# Patient Record
Sex: Male | Born: 1950 | Race: Black or African American | Hispanic: No | Smoking: Former smoker
Health system: Southern US, Community
[De-identification: ages and names within clinical notes are randomized; demographics above are authoritative.]

## PROBLEM LIST (undated history)

## (undated) DIAGNOSIS — K219 Gastro-esophageal reflux disease without esophagitis: Secondary | ICD-10-CM

## (undated) DIAGNOSIS — I120 Hypertensive chronic kidney disease with stage 5 chronic kidney disease or end stage renal disease: Secondary | ICD-10-CM

## (undated) DIAGNOSIS — E785 Hyperlipidemia, unspecified: Secondary | ICD-10-CM

## (undated) DIAGNOSIS — I639 Cerebral infarction, unspecified: Secondary | ICD-10-CM

## (undated) DIAGNOSIS — I6203 Nontraumatic chronic subdural hemorrhage: Secondary | ICD-10-CM

## (undated) DIAGNOSIS — N186 End stage renal disease: Secondary | ICD-10-CM

## (undated) DIAGNOSIS — N289 Disorder of kidney and ureter, unspecified: Secondary | ICD-10-CM

## (undated) DIAGNOSIS — Z992 Dependence on renal dialysis: Secondary | ICD-10-CM

## (undated) DIAGNOSIS — E1122 Type 2 diabetes mellitus with diabetic chronic kidney disease: Secondary | ICD-10-CM

## (undated) DIAGNOSIS — I10 Essential (primary) hypertension: Secondary | ICD-10-CM

## (undated) DIAGNOSIS — F4320 Adjustment disorder, unspecified: Secondary | ICD-10-CM

## (undated) DIAGNOSIS — M109 Gout, unspecified: Secondary | ICD-10-CM

## (undated) DIAGNOSIS — I4891 Unspecified atrial fibrillation: Secondary | ICD-10-CM

---

## 1997-05-13 ENCOUNTER — Ambulatory Visit (HOSPITAL_COMMUNITY): Admission: RE | Admit: 1997-05-13 | Discharge: 1997-05-13 | Payer: Self-pay | Admitting: Cardiology

## 1999-01-05 ENCOUNTER — Encounter: Admission: RE | Admit: 1999-01-05 | Discharge: 1999-01-05 | Payer: Self-pay | Admitting: Cardiology

## 1999-01-05 ENCOUNTER — Encounter: Payer: Self-pay | Admitting: Cardiology

## 2000-02-14 ENCOUNTER — Observation Stay (HOSPITAL_COMMUNITY): Admission: EM | Admit: 2000-02-14 | Discharge: 2000-02-15 | Payer: Self-pay | Admitting: Emergency Medicine

## 2000-02-14 ENCOUNTER — Encounter: Payer: Self-pay | Admitting: Emergency Medicine

## 2000-04-24 ENCOUNTER — Encounter: Payer: Self-pay | Admitting: Surgery

## 2000-04-24 ENCOUNTER — Encounter: Admission: RE | Admit: 2000-04-24 | Discharge: 2000-04-24 | Payer: Self-pay | Admitting: Surgery

## 2000-07-03 ENCOUNTER — Ambulatory Visit (HOSPITAL_COMMUNITY): Admission: RE | Admit: 2000-07-03 | Discharge: 2000-07-03 | Payer: Self-pay | Admitting: Gastroenterology

## 2001-01-30 ENCOUNTER — Inpatient Hospital Stay (HOSPITAL_COMMUNITY): Admission: EM | Admit: 2001-01-30 | Discharge: 2001-02-01 | Payer: Self-pay

## 2017-10-29 ENCOUNTER — Emergency Department (HOSPITAL_COMMUNITY): Payer: Medicare Other

## 2017-10-29 ENCOUNTER — Observation Stay (HOSPITAL_COMMUNITY)
Admission: EM | Admit: 2017-10-29 | Discharge: 2017-10-30 | Disposition: A | Discharge status: Skilled Nursing Facility | Payer: Medicare Other | Attending: Internal Medicine | Admitting: Internal Medicine

## 2017-10-29 ENCOUNTER — Other Ambulatory Visit: Payer: Self-pay

## 2017-10-29 ENCOUNTER — Encounter (HOSPITAL_COMMUNITY): Payer: Self-pay | Admitting: Emergency Medicine

## 2017-10-29 DIAGNOSIS — Z794 Long term (current) use of insulin: Secondary | ICD-10-CM | POA: Insufficient documentation

## 2017-10-29 DIAGNOSIS — R4182 Altered mental status, unspecified: Secondary | ICD-10-CM | POA: Diagnosis not present

## 2017-10-29 DIAGNOSIS — F4320 Adjustment disorder, unspecified: Secondary | ICD-10-CM | POA: Insufficient documentation

## 2017-10-29 DIAGNOSIS — Z992 Dependence on renal dialysis: Secondary | ICD-10-CM | POA: Diagnosis not present

## 2017-10-29 DIAGNOSIS — E1122 Type 2 diabetes mellitus with diabetic chronic kidney disease: Secondary | ICD-10-CM | POA: Insufficient documentation

## 2017-10-29 DIAGNOSIS — I959 Hypotension, unspecified: Principal | ICD-10-CM | POA: Diagnosis present

## 2017-10-29 DIAGNOSIS — G9340 Encephalopathy, unspecified: Secondary | ICD-10-CM | POA: Diagnosis not present

## 2017-10-29 DIAGNOSIS — F411 Generalized anxiety disorder: Secondary | ICD-10-CM | POA: Diagnosis not present

## 2017-10-29 DIAGNOSIS — Z7901 Long term (current) use of anticoagulants: Secondary | ICD-10-CM | POA: Diagnosis not present

## 2017-10-29 DIAGNOSIS — K219 Gastro-esophageal reflux disease without esophagitis: Secondary | ICD-10-CM | POA: Insufficient documentation

## 2017-10-29 DIAGNOSIS — Z8249 Family history of ischemic heart disease and other diseases of the circulatory system: Secondary | ICD-10-CM | POA: Insufficient documentation

## 2017-10-29 DIAGNOSIS — E785 Hyperlipidemia, unspecified: Secondary | ICD-10-CM | POA: Insufficient documentation

## 2017-10-29 DIAGNOSIS — N186 End stage renal disease: Secondary | ICD-10-CM | POA: Diagnosis present

## 2017-10-29 DIAGNOSIS — I120 Hypertensive chronic kidney disease with stage 5 chronic kidney disease or end stage renal disease: Secondary | ICD-10-CM | POA: Insufficient documentation

## 2017-10-29 DIAGNOSIS — Z7982 Long term (current) use of aspirin: Secondary | ICD-10-CM | POA: Insufficient documentation

## 2017-10-29 DIAGNOSIS — I69351 Hemiplegia and hemiparesis following cerebral infarction affecting right dominant side: Secondary | ICD-10-CM

## 2017-10-29 DIAGNOSIS — M109 Gout, unspecified: Secondary | ICD-10-CM | POA: Insufficient documentation

## 2017-10-29 DIAGNOSIS — Z87891 Personal history of nicotine dependence: Secondary | ICD-10-CM | POA: Insufficient documentation

## 2017-10-29 DIAGNOSIS — I480 Paroxysmal atrial fibrillation: Secondary | ICD-10-CM | POA: Diagnosis present

## 2017-10-29 DIAGNOSIS — I6203 Nontraumatic chronic subdural hemorrhage: Secondary | ICD-10-CM | POA: Diagnosis present

## 2017-10-29 HISTORY — DX: Adjustment disorder, unspecified: F43.20

## 2017-10-29 HISTORY — DX: Hyperlipidemia, unspecified: E78.5

## 2017-10-29 HISTORY — DX: Gout, unspecified: M10.9

## 2017-10-29 HISTORY — DX: Dependence on renal dialysis: Z99.2

## 2017-10-29 HISTORY — DX: Unspecified atrial fibrillation: I48.91

## 2017-10-29 HISTORY — DX: Gastro-esophageal reflux disease without esophagitis: K21.9

## 2017-10-29 HISTORY — DX: Type 2 diabetes mellitus with diabetic chronic kidney disease: E11.22

## 2017-10-29 HISTORY — DX: Essential (primary) hypertension: I10

## 2017-10-29 HISTORY — DX: Disorder of kidney and ureter, unspecified: N28.9

## 2017-10-29 HISTORY — DX: Nontraumatic chronic subdural hemorrhage: I62.03

## 2017-10-29 HISTORY — DX: Type 2 diabetes mellitus with diabetic chronic kidney disease: I12.0

## 2017-10-29 HISTORY — DX: End stage renal disease: N18.6

## 2017-10-29 HISTORY — DX: Cerebral infarction, unspecified: I63.9

## 2017-10-29 LAB — DIFFERENTIAL
ABS IMMATURE GRANULOCYTES: 0.04 10*3/uL (ref 0.00–0.07)
BASOS ABS: 0 10*3/uL (ref 0.0–0.1)
BASOS PCT: 0 %
EOS ABS: 0.3 10*3/uL (ref 0.0–0.5)
EOS PCT: 3 %
IMMATURE GRANULOCYTES: 0 %
Lymphocytes Relative: 16 %
Lymphs Abs: 1.5 10*3/uL (ref 0.7–4.0)
Monocytes Absolute: 1.3 10*3/uL — ABNORMAL HIGH (ref 0.1–1.0)
Monocytes Relative: 13 %
NEUTROS PCT: 68 %
Neutro Abs: 6.5 10*3/uL (ref 1.7–7.7)

## 2017-10-29 LAB — COMPREHENSIVE METABOLIC PANEL
ALBUMIN: 2.1 g/dL — AB (ref 3.5–5.0)
ALT: 10 U/L (ref 0–44)
ANION GAP: 10 (ref 5–15)
AST: 19 U/L (ref 15–41)
Alkaline Phosphatase: 78 U/L (ref 38–126)
BUN: 30 mg/dL — AB (ref 8–23)
CHLORIDE: 100 mmol/L (ref 98–111)
CO2: 26 mmol/L (ref 22–32)
Calcium: 8.5 mg/dL — ABNORMAL LOW (ref 8.9–10.3)
Creatinine, Ser: 4.41 mg/dL — ABNORMAL HIGH (ref 0.61–1.24)
GFR calc Af Amer: 15 mL/min — ABNORMAL LOW (ref >60)
GFR calc non Af Amer: 13 mL/min — ABNORMAL LOW (ref >60)
GLUCOSE: 116 mg/dL — AB (ref 70–99)
POTASSIUM: 5.2 mmol/L — AB (ref 3.5–5.1)
SODIUM: 136 mmol/L (ref 135–145)
Total Bilirubin: 0.6 mg/dL (ref 0.3–1.2)
Total Protein: 5.2 g/dL — ABNORMAL LOW (ref 6.5–8.1)

## 2017-10-29 LAB — I-STAT VENOUS BLOOD GAS, ED
Acid-Base Excess: 7 mmol/L — ABNORMAL HIGH (ref 0.0–2.0)
Bicarbonate: 30.6 mmol/L — ABNORMAL HIGH (ref 20.0–28.0)
O2 SAT: 100 %
PCO2 VEN: 38.2 mmHg — AB (ref 44.0–60.0)
PH VEN: 7.511 — AB (ref 7.250–7.430)
PO2 VEN: 168 mmHg — AB (ref 32.0–45.0)
TCO2: 32 mmol/L (ref 22–32)

## 2017-10-29 LAB — I-STAT CHEM 8, ED
BUN: 29 mg/dL — AB (ref 8–23)
CALCIUM ION: 1.08 mmol/L — AB (ref 1.15–1.40)
Chloride: 98 mmol/L (ref 98–111)
Creatinine, Ser: 4.9 mg/dL — ABNORMAL HIGH (ref 0.61–1.24)
Glucose, Bld: 112 mg/dL — ABNORMAL HIGH (ref 70–99)
HEMATOCRIT: 27 % — AB (ref 39.0–52.0)
Hemoglobin: 9.2 g/dL — ABNORMAL LOW (ref 13.0–17.0)
Potassium: 5.1 mmol/L (ref 3.5–5.1)
SODIUM: 136 mmol/L (ref 135–145)
TCO2: 29 mmol/L (ref 22–32)

## 2017-10-29 LAB — CBC
HCT: 28.9 % — ABNORMAL LOW (ref 39.0–52.0)
Hemoglobin: 8.6 g/dL — ABNORMAL LOW (ref 13.0–17.0)
MCH: 24.9 pg — ABNORMAL LOW (ref 26.0–34.0)
MCHC: 29.8 g/dL — ABNORMAL LOW (ref 30.0–36.0)
MCV: 83.8 fL (ref 80.0–100.0)
NRBC: 0 % (ref 0.0–0.2)
PLATELETS: 219 10*3/uL (ref 150–400)
RBC: 3.45 MIL/uL — AB (ref 4.22–5.81)
RDW: 17.7 % — ABNORMAL HIGH (ref 11.5–15.5)
WBC: 9.6 10*3/uL (ref 4.0–10.5)

## 2017-10-29 LAB — PROTIME-INR
INR: 1.16
Prothrombin Time: 14.7 seconds (ref 11.4–15.2)

## 2017-10-29 LAB — MRSA PCR SCREENING: MRSA by PCR: NEGATIVE

## 2017-10-29 LAB — APTT: APTT: 46 s — AB (ref 24–36)

## 2017-10-29 LAB — I-STAT TROPONIN, ED: Troponin i, poc: 0.01 ng/mL (ref 0.00–0.08)

## 2017-10-29 LAB — GLUCOSE, CAPILLARY: Glucose-Capillary: 137 mg/dL — ABNORMAL HIGH (ref 70–99)

## 2017-10-29 LAB — CBG MONITORING, ED: GLUCOSE-CAPILLARY: 107 mg/dL — AB (ref 70–99)

## 2017-10-29 LAB — TSH: TSH: 3.226 u[IU]/mL (ref 0.350–4.500)

## 2017-10-29 MED ORDER — HEPARIN SODIUM (PORCINE) 5000 UNIT/ML IJ SOLN
5000.0000 [IU] | Freq: Three times a day (TID) | INTRAMUSCULAR | Status: DC
  Administered 2017-10-30 (×3): 5000 [IU] via SUBCUTANEOUS
  Filled 2017-10-29 (×3): qty 1

## 2017-10-29 MED ORDER — ACETAMINOPHEN 650 MG RE SUPP: 650.0000 mg | Freq: Four times a day (QID) | RECTAL | Status: DC | PRN

## 2017-10-29 MED ORDER — SODIUM CHLORIDE 0.9 % IV BOLUS
1000.0000 mL | Freq: Once | INTRAVENOUS | Status: AC
  Administered 2017-10-29: 1000 mL via INTRAVENOUS

## 2017-10-29 MED ORDER — INSULIN ASPART 100 UNIT/ML ~~LOC~~ SOLN: 0.0000 [IU] | Freq: Three times a day (TID) | SUBCUTANEOUS | Status: DC

## 2017-10-29 MED ORDER — FLECAINIDE ACETATE 100 MG PO TABS
100.0000 mg | ORAL_TABLET | Freq: Two times a day (BID) | ORAL | Status: DC
  Administered 2017-10-30 (×2): 100 mg via ORAL
  Filled 2017-10-29 (×3): qty 1

## 2017-10-29 MED ORDER — ACETAMINOPHEN 325 MG PO TABS: 650.0000 mg | ORAL_TABLET | Freq: Four times a day (QID) | ORAL | Status: DC | PRN

## 2017-10-29 MED ORDER — SODIUM CHLORIDE 0.9 % IV SOLN
INTRAVENOUS | Status: DC
  Administered 2017-10-29 – 2017-10-30 (×2): via INTRAVENOUS

## 2017-10-29 MED ORDER — ATORVASTATIN CALCIUM 20 MG PO TABS
40.0000 mg | ORAL_TABLET | Freq: Every day | ORAL | Status: DC
  Administered 2017-10-30: 40 mg via ORAL
  Filled 2017-10-29: qty 2

## 2017-10-29 NOTE — ED Notes (Signed)
Patient transported to CT 

## 2017-10-29 NOTE — ED Notes (Signed)
Manual blood pressure 62/48

## 2017-10-29 NOTE — ED Provider Notes (Signed)
[facility] EMERGENCY DEPARTMENT Provider Note   CSN: [id number] Arrival date & time: 10/29/17  1037     History   Chief Complaint Chief Complaint  Patient presents with  . Weakness    HPI Guy Fry is a 67 y.o. male.  HPI   Patient presents from a skilled nursing facility where he is in rehab for right-sided stroke.  Apparently his symptoms worsened today so he was sent here for evaluation.  The patient is conversant but is unable to give a cogent history.  Level 5 caveat-3 mental status  Past Medical History:  Diagnosis Date  . Renal disorder     There are no active problems to display for this patient.   History reviewed. No pertinent surgical history.      Home Medications    Prior to Admission medications   Medication Sig Start Date End Date Taking? Authorizing Provider  acetaminophen (TYLENOL) 650 MG CR tablet Take 650 mg by mouth every 4 (four) hours as needed for pain.   Yes [provider]  allopurinol (ZYLOPRIM) 100 MG tablet Take 100 mg by mouth daily.   Yes [provider]  amLODipine (NORVASC) 10 MG tablet Take 10 mg by mouth daily.   Yes [provider]  aspirin EC 81 MG tablet Take 81 mg by mouth daily.   Yes [provider]  atorvastatin (LIPITOR) 40 MG tablet Take 40 mg by mouth daily.   Yes [provider]  calcium acetate (PHOSLO) 667 MG capsule Take 1,334 mg by mouth 2 (two) times daily with a meal.   Yes [provider]  carvedilol (COREG) 25 MG tablet Take 25 mg by mouth 2 (two) times daily with a meal.   Yes [provider]  cloNIDine (CATAPRES) 0.3 MG tablet Take 0.3 mg by mouth every 8 (eight) hours.   Yes [provider]  colchicine 0.6 MG tablet Take 0.6 mg by mouth daily.   Yes [provider]  flecainide (TAMBOCOR) 100 MG tablet Take 100 mg by mouth 2 (two) times daily.   Yes [provider]  gabapentin (NEURONTIN) 100 MG  capsule Take 100 mg by mouth 2 (two) times daily.   Yes [provider]  hydrALAZINE (APRESOLINE) 25 MG tablet Take 25 mg by mouth 3 (three) times daily.   Yes [provider]  HYDROcodone-acetaminophen (NORCO/VICODIN) 5-325 MG tablet Take 1 tablet by mouth every 6 (six) hours as needed for moderate pain.   Yes [provider]  insulin detemir (LEVEMIR) 100 UNIT/ML injection Inject 10 Units into the skin every 12 (twelve) hours.   Yes [provider]  ketoconazole (NIZORAL) 2 % cream Apply 1 application topically daily.   Yes [provider]  minoxidil (LONITEN) 2.5 MG tablet Take 5 mg by mouth 2 (two) times daily.   Yes [provider]  NIFEdipine (PROCARDIA XL/NIFEDICAL-XL) 90 MG 24 hr tablet Take 90 mg by mouth daily.   Yes [provider]  NON FORMULARY Apply 1 application topically 3 (three) times daily. Dermacloud  Cream  Use every shift change on buttocks to prevent breakdown   Yes [provider]  omeprazole (PRILOSEC) 20 MG capsule Take 20 mg by mouth daily.   Yes [provider]  polyethylene glycol (MIRALAX / GLYCOLAX) packet Take 17 g by mouth daily.   Yes [provider]  sennosides-docusate sodium (SENOKOT-S) 8.6-50 MG tablet Take 2 tablets by mouth 2 (two) times daily.  Yes [provider]  Skin Protectants, Misc. (EUCERIN) cream Apply 1 application topically daily. Apply to dry flaky skin   Yes [provider]    Family History No family history on file.  Social History Social History   Tobacco Use  . Smoking status: Former Games developer  . Smokeless tobacco: Never Used  Substance Use Topics  . Alcohol use: Not Currently  . Drug use: Never     Allergies   Lisinopril   Review of Systems Review of Systems  Unable to perform ROS: Mental status change     Physical Exam Updated Vital Signs BP (!) 74/57   Pulse (!) 55   Resp 15   Ht 5\' 7"  (1.702 m)   Wt 90.7 kg    SpO2 98%   BMI 31.32 kg/m   Physical Exam  Constitutional: He appears well-developed. He appears distressed (Uncomfortable.  Falls asleep frequently during history and physical exam).  He appears older than stated age.  HENT:  Head: Normocephalic and atraumatic.  Right Ear: External ear normal.  Left Ear: External ear normal.  Dry oral mucous membranes.  Eyes: Pupils are equal, round, and reactive to light. Conjunctivae and EOM are normal.  Neck: Normal range of motion and phonation normal. Neck supple.  Cardiovascular: Normal rate, regular rhythm and normal heart sounds.  Pulmonary/Chest: Effort normal and breath sounds normal. He exhibits no bony tenderness.  Abdominal: Soft. There is no tenderness.  Musculoskeletal:  Chronic appearing edema of right leg and right arm.  Able to lift left arm and left leg off stretcher.  Minimal movement right arm and right leg, on command, unable to lift off stretcher.  Neurological: He is alert. No cranial nerve deficit or sensory deficit. He exhibits normal muscle tone. Coordination normal.  Patient's eyes closed but opens to voice.  He responds slowly, without dysarthria.  No clear evidence for expressive or receptive aphasia.  Patient is lethargic.  Skin: Skin is warm, dry and intact.  Psychiatric: He has a normal mood and affect. His behavior is normal.  Nursing note and vitals reviewed.    ED Treatments / Results  Labs (all labs ordered are listed, but only abnormal results are displayed) Labs Reviewed  APTT - Abnormal; Notable for the following components:      Result Value   aPTT 46 (*)    All other components within normal limits  CBC - Abnormal; Notable for the following components:   RBC 3.45 (*)    Hemoglobin 8.6 (*)    HCT 28.9 (*)    MCH 24.9 (*)    MCHC 29.8 (*)    RDW 17.7 (*)    All other components within normal limits  DIFFERENTIAL - Abnormal; Notable for the following components:   Monocytes Absolute 1.3 (*)    All  other components within normal limits  COMPREHENSIVE METABOLIC PANEL - Abnormal; Notable for the following components:   Potassium 5.2 (*)    Glucose, Bld 116 (*)    BUN 30 (*)    Creatinine, Ser 4.41 (*)    Calcium 8.5 (*)    Total Protein 5.2 (*)    Albumin 2.1 (*)    GFR calc non Af Amer 13 (*)    GFR calc Af Amer 15 (*)    All other components within normal limits  CBG MONITORING, ED - Abnormal; Notable for the following components:   Glucose-Capillary 107 (*)    All other components within normal limits  I-STAT CHEM  8, ED - Abnormal; Notable for the following components:   BUN 29 (*)    Creatinine, Ser 4.90 (*)    Glucose, Bld 112 (*)    Calcium, Ion 1.08 (*)    Hemoglobin 9.2 (*)    HCT 27.0 (*)    All other components within normal limits  I-STAT VENOUS BLOOD GAS, ED - Abnormal; Notable for the following components:   pH, Ven 7.511 (*)    pCO2, Ven 38.2 (*)    pO2, Ven 168.0 (*)    Bicarbonate 30.6 (*)    Acid-Base Excess 7.0 (*)    All other components within normal limits  PROTIME-INR  BLOOD GAS, VENOUS  I-STAT TROPONIN, ED    EKG EKG Interpretation  Date/Time:  Sunday October 29 2017 10:44:15 EDT Ventricular Rate:  60 PR Interval:    QRS Duration: 122 QT Interval:  500 QTC Calculation: 500 R Axis:   -17 Text Interpretation:  Sinus rhythm Prolonged PR interval Nonspecific intraventricular conduction delay Anterior infarct, old Minimal ST elevation, inferior leads Since last tracing Atrial fibrillation resolved Confirmed by Mancel Bale [phone]) on 10/29/2017 10:57:20 AM Also confirmed by Mancel Bale [phone]), editor Sheppard Evens ([id number])  on 10/29/2017 11:24:39 AM   Radiology Ct Head Wo Contrast  Result Date: 10/29/2017 CLINICAL DATA:  Focal neuro deficit.  Right-sided weakness. EXAM: CT HEAD WITHOUT CONTRAST TECHNIQUE: Contiguous axial images were obtained from the base of the skull through the vertex without intravenous contrast. COMPARISON:  None.  FINDINGS: Brain: Moderate atrophy. Chronic microvascular ischemic type changes in the white matter. Right parietal extra-axial fluid collection intermediate to low-density measuring 12 mm in thickness. Fluid collection is focal suggesting a chronic subdural hematoma. No midline shift. No acute high-density hemorrhage. Negative for mass lesion. Vascular: Negative for hyperdense vessel High density is present in the left middle meningeal artery branches. Transcranial density extends into the left parietal scalp. 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 No evidence of meningioma on the current study. No prior brain imaging available for comparison. Skull: Negative for skull lesion. Sinuses/Orbits: Negative Other: None IMPRESSION: Moderate atrophy and chronic ischemic change.  No acute infarct 12 mm right parietal low to intermediate density fluid collection most likely a chronic subdural hematoma. No midline shift Hyperdense material throughout the left middle meningeal artery most likely embolic material. Correlate with history. Electronically Signed   By: Marlan Palau M.D.   On: 10/29/2017 12:19   Dg Chest Port 1 View  Result Date: 10/29/2017 CLINICAL DATA:  RIGHT-sided leg weakness, history of CVA 1 month ago with RIGHT-sided deficits. EXAM: PORTABLE CHEST 1 VIEW COMPARISON:  None. FINDINGS: Heart size is upper normal. RIGHT-sided dialysis catheter appears appropriately position with tip at the level of the lower SVC/cavoatrial junction. Lungs are clear. No pleural effusion or pneumothorax seen. No acute or suspicious osseous finding. IMPRESSION: No active disease.  No evidence of pneumonia or pulmonary edema. Electronically Signed   By: Bary Richard M.D.   On: 10/29/2017 12:11    Procedures .Critical Care Performed by: Mancel Bale, MD Authorized by: Mancel Bale, MD   Critical care provider statement:    Critical care time (minutes):  35   Critical care start  time:  10/29/2017 10:30 AM   Critical care end time:  10/29/2017 3:30 PM   Critical care time was exclusive of:  Separately billable procedures and treating other patients   Critical care was necessary to treat or prevent imminent or life-threatening deterioration of  the following conditions:  Circulatory failure   Critical care was time spent personally by me on the following activities:  Blood draw for specimens, development of treatment plan with patient or surrogate, discussions with consultants, evaluation of patient's response to treatment, examination of patient, obtaining history from patient or surrogate, ordering and performing treatments and interventions, ordering and review of laboratory studies, pulse oximetry, re-evaluation of patient's condition, review of old charts and ordering and review of radiographic studies   (including critical care time)  Medications Ordered in ED Medications  sodium chloride 0.9 % bolus 1,000 mL (0 mLs Intravenous Stopped 10/29/17 1400)     Initial Impression / Assessment and Plan / ED Course  I have reviewed the triage vital signs and the nursing notes.  Pertinent labs & imaging results that were available during my care of the patient were reviewed by me and considered in my medical decision making (see chart for details).  Clinical Course as of Oct 29 1529  Wynelle Link Oct 29, 2017  1147 IV fluids initiated for hypotension.  Apparently dialyzed yesterday.   [EW]  1147 No acute bleeding or CVA, images reviewed by me  CT HEAD WO CONTRAST [EW]  1148 Normal except pH elevated, PCO2 low, PO2 high  I-Stat venous blood gas, ED(!) [EW]  1148 Normal except BUN high, creatinine high, glucose high, calcium low, hemoglobin low  I-Stat Chem 8, ED(!) [EW]  1148 Normal  Protime-INR [EW]  1149 Normal except hemoglobin low  CBC(!) [EW]  1149 Normal  Differential(!) [EW]  1328 Subacute right brain with subdural hematoma.  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  Images reviewed by me.  CT HEAD WO CONTRAST [EW]  1527 Patient complains of a sore on his bottom.  Buttocks and perianal tissue observed, mild associated fecal incontinence, no evidence for skin breakdown or ulcer.  Patient will be cleansed and barrier cream applied, by nursing.   [EW]    Clinical Course User Index [EW] Mancel Bale, MD     Patient Vitals for the past 24 hrs:  BP Pulse Resp SpO2 Height Weight  10/29/17 1415 (!) 74/57 (!) 55 15 98 % - -  10/29/17 1400 (!) 82/54 (!) 56 13 98 % - -  10/29/17 1345 (!) 82/55 (!) 57 18 97 % - -  10/29/17 1330 (!) 80/58 (!) 56 18 99 % - -  10/29/17 1315 (!) 88/59 (!) 57 19 97 % - -  10/29/17 1300 (!) 86/58 (!) 57 17 97 % - -  10/29/17 1200 (!) 80/54 (!) 57 15 99 % - -  10/29/17 1145 (!) 87/58 (!) 56 10 99 % - -  10/29/17 1130 (!) 81/59 (!) 58 18 96 % - -  10/29/17 1115 90/63 (!) 57 14 99 % - -  10/29/17 1100 (!) 89/59 (!) 58 17 100 % - -  10/29/17 1050 - - - - 5\' 7"  (1.702 m) 90.7 kg  10/29/17 1045 97/64 (!) 59 17 98 % - -   2:30 PM-discussed with nephrologist, Dr. Arlean Hopping, who will contact the patient's dialysis center to inform them of his low blood pressure episode today.  3:27 PM Reevaluation with update and discussion. After initial assessment and treatment, an updated evaluation reveals patient is eating an entire [place] [employer] sub-family members here.  Patient remains hypotensive but he is alert and not lethargic.  Family updated on findings and plan. Mancel Bale   Medical Decision Making: Lethargy,  likely multifactorial.  Incidental hypotension associated with recent dialysis, yesterday.  Treated with IV fluid bolus, and was able to eat and drink in the ED.  He is on for blood pressure medications that will need to be modified and likely are contributing to hypotension.  Also suspect that his new dialysis unit, may have different procedures and/or processes that have contributed  to increased volume loss, that can be adjusted, with further dialysis treatment episodes.  No evidence for acute CVA, metabolic instability, bacterial infection or impending vascular collapse.  CRITICAL CARE-yes Performed by: Mancel Bale  Nursing Notes Reviewed/ Care Coordinated Applicable Imaging Reviewed Interpretation of Laboratory Data incorporated into ED treatment   Plan: Disposition per Dr. Deretha Emory, after check on vitals.  Final Clinical Impressions(s) / ED Diagnoses   Final diagnoses:  Hypotension due to hypovolemia  End stage renal disease [facility])    ED Discharge Orders    None       Mancel Bale, MD 10/29/17 2140

## 2017-10-29 NOTE — ED Notes (Signed)
Family brought food for pt to eat, pt sitting up eating at this time.

## 2017-10-29 NOTE — Progress Notes (Signed)
Guy Fry [id number] Admitted to 6V78: 10/29/2017 8:51 PM Attending Provider: Gust Rung, DO    Guy Fry is a 67 y.o. male patient admitted from ED awake, alert  & orientated  X 3,  Full Code, VSS - Blood pressure (!) 77/53, pulse 60, resp. rate 20, height 5\' 7"  (1.702 m), weight 90.7 kg, SpO2 96 %.RA, no c/o shortness of breath, no c/o chest pain, no distress noted. Tele #[id number] placed and pt is currently running: SR   IV site WDL:  with a transparent dsg that's clean dry and intact.  Allergies:   Allergies  Allergen Reactions  . Lisinopril      Past Medical History:  Diagnosis Date  . Adjustment disorder   . Atrial fibrillation (HCC)   . 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 CVA (cerebral vascular accident) (HCC)   . GERD (gastroesophageal reflux disease)   . Gout   . HLD (hyperlipidemia)   . HTN (hypertension)   . Renal disorder   . 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 Information packet given to patient. Admission INP armband ID verified with patient, and in place. SR up x 2, fall risk assessment complete with Patient and family verbalizing understanding of risks associated with falls. Pt verbalizes an understanding of how to use the call bell and to call for help before getting out of bed.  Skin, clean-dry- intact without evidence of bruising, or skin tears; discoloration from healed wound on buttocks.  Skin dry and flaky. No evidence of skin break down noted on exam.    Will cont to monitor and assist as needed.  Guy Ponder, RN 10/29/2017 8:51 PM

## 2017-10-29 NOTE — H&P (Addendum)
Date: 10/29/2017               Patient Name:  Guy Fry MRN: [id number]  DOB: Nov 26, 1950 Age / Sex: 67 y.o., male   PCP: Eloisa Northern, MD         Medical Service: Internal Medicine Teaching Service         Attending Physician: Dr. Gust Rung, DO    First Contact: Dr. Gwyneth Revels Pager: [phone]  Second Contact: Dr. Caron Presume Pager: [phone]       After Hours (After 5p/  First Contact Pager: [phone]  weekends / holidays): Second Contact Pager: [phone]   Chief Complaint: Altered mental status  History of Present Illness: This is a 67 year old male with a history of subdural hematoma in July 2019, CVA with right sided residual weakness, ESRD (on HD Tues, Thurs, Sat), atrial fibrillation (on coumadin), HTN, HLD, gout, GERD, adjustment disorder, and GAD who presented from a SNF for weakness and hypotension. He is accompanied by his son who helps provide the history.  Son reports that he has been intermittent decreased alertness since July he was diagnosed with a hematoma. Him and his brother had drove the patient up from [place] on Thursday, 3 days prior to admission, and brought him to [facility]. The patient had a full on Thursday the facility where he getting up to go to the bathroom and fell out of bed it is unclear if he hit his or sustained any other damages.  He was alert and active Friday and was able to cross his legs and laughing and joking with his family.  He went to hemodialysis on Saturday and completed his course.  Today he was found to have more right-sided leg weakness.  The patient ports that he has been very sleepy recently and cannot seem to stay awake, he denies any lightheadedness or dizziness.  He also denies any bleeding including hematuria or hematochezia or melena.  He does report that he is urinating less.  In July 2019 [facility] was at [facility] [place] for weakness and was found to have a hematoma.  This is when he developed severe kidney  issues and had end-stage renal disease requiring dialysis.  He had improved for a little but went to a rehab facility in had to be admitted again and placed in the ICU because his hematoma was not absorbing.  After he left the ICU he went to a rehab facility in [place] and then was able to move up to a [place] rehab to be closer to his family.  In the ED patient was found to have a RR 17, O2 98%, HR 59, BP 97/64. V-BG showed pH 7.511, PCO2 38.2, PO2 168, and Bicarb 30.6. I-stat Chem 8 showed Na 136, K 5.1, Cl 98, BUN 29, Cr 4.9 (ESRD on HD), Hgb 9.2. CBC showed WBC 9.6, Hgb 8.6, MCV 83.8, Plt 219. APTT was 46. CMP showed a K 5.2. CXR was unremarkable. CT head showed moderate atrophy and chronic ischemic changes, no acute infarct, 12 mm right parietal low to intermediate density fluid collection most likely a chronic subdural hematoma, no midline shift, and hyperdense material throughout left middle meningeal artery most likely embolic material. He was given 1L NS. He was mentating well. EDP did a POCUS of the heart which showed no pericardial effusion. Patient was admitted to internal medicine.   Meds:  Current Meds  Medication Sig  . acetaminophen (TYLENOL) 650  MG CR tablet Take 650 mg by mouth every 4 (four) hours as needed for pain.  Marland Kitchen allopurinol (ZYLOPRIM) 100 MG tablet Take 100 mg by mouth daily.  Marland Kitchen aspirin EC 81 MG tablet Take 81 mg by mouth daily.  Marland Kitchen atorvastatin (LIPITOR) 40 MG tablet Take 40 mg by mouth daily.  . calcium acetate (PHOSLO) 667 MG capsule Take 1,334 mg by mouth 2 (two) times daily with a meal.  . colchicine 0.6 MG tablet Take 0.6 mg by mouth daily.  . flecainide (TAMBOCOR) 100 MG tablet Take 100 mg by mouth 2 (two) times daily.  Marland Kitchen gabapentin (NEURONTIN) 100 MG capsule Take 100 mg by mouth 2 (two) times daily.  Marland Kitchen HYDROcodone-acetaminophen (NORCO/VICODIN) 5-325 MG tablet Take 1 tablet by mouth every 6 (six) hours as needed for moderate pain.  Marland Kitchen insulin detemir  (LEVEMIR) 100 UNIT/ML injection Inject 10 Units into the skin every 12 (twelve) hours.  Marland Kitchen ketoconazole (NIZORAL) 2 % cream Apply 1 application topically daily.  . minoxidil (LONITEN) 2.5 MG tablet Take 5 mg by mouth 2 (two) times daily.  Marland Kitchen NIFEdipine (PROCARDIA XL/NIFEDICAL-XL) 90 MG 24 hr tablet Take 90 mg by mouth daily.  . NON FORMULARY Apply 1 application topically 3 (three) times daily. Dermacloud  Cream  Use every shift change on buttocks to prevent breakdown  . omeprazole (PRILOSEC) 20 MG capsule Take 20 mg by mouth daily.  . polyethylene glycol (MIRALAX / GLYCOLAX) packet Take 17 g by mouth daily.  . sennosides-docusate sodium (SENOKOT-S) 8.6-50 MG tablet Take 2 tablets by mouth 2 (two) times daily.  . Skin Protectants, Misc. (EUCERIN) cream Apply 1 application topically daily. Apply to dry flaky skin  . [DISCONTINUED] amLODipine (NORVASC) 10 MG tablet Take 10 mg by mouth daily.  . [DISCONTINUED] carvedilol (COREG) 25 MG tablet Take 25 mg by mouth 2 (two) times daily with a meal.  . [DISCONTINUED] cloNIDine (CATAPRES) 0.3 MG tablet Take 0.3 mg by mouth every 8 (eight) hours.  . [DISCONTINUED] hydrALAZINE (APRESOLINE) 25 MG tablet Take 25 mg by mouth 3 (three) times daily.     Allergies: Allergies as of 10/29/2017 - Review Complete 10/29/2017  Allergen Reaction Noted  . Lisinopril  10/29/2017   Past Medical History:  Diagnosis Date  . Adjustment disorder   . Atrial fibrillation (HCC)   . 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 CVA (cerebral vascular accident) (HCC)   . GERD (gastroesophageal reflux disease)   . Gout   . HLD (hyperlipidemia)   . HTN (hypertension)   . Renal disorder   . Type 2 DM with hypertension and ESRD on dialysis [facility])     Family History: Father and mother had heart attack, has hypertension and hyperlipidemia.   Social History: Patient recently moved here from [place] a few days prior to admission to be closer to his family.  He is currently living at  [facility].  Son reports that he quit smoking years ago that have been a heavy drinker.  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  Review of Systems: A complete ROS was negative except as per HPI.  Review of Systems  Constitutional: Positive for malaise/fatigue. Negative for chills and fever.  Respiratory: Negative for cough, shortness of breath and wheezing.   Cardiovascular: Negative for chest pain, palpitations and orthopnea.  Gastrointestinal: Negative for abdominal pain, constipation, diarrhea, nausea and vomiting.  Genitourinary: Positive for frequency (Decreased freuency). Negative for dysuria and urgency.  Musculoskeletal:  Negative for back pain, joint pain and myalgias.  Skin: Negative for itching and rash.  Neurological: Positive for speech change and weakness (right arm and leg). Negative for dizziness, tingling and headaches.  Psychiatric/Behavioral: Negative for depression. The patient is not nervous/anxious.      Physical Exam: Blood pressure (!) 78/56, pulse (!) 55, resp. rate (!) 21, height 5\' 7"  (1.702 m), weight 90.7 kg, SpO2 95 %. Physical Exam  Constitutional: He is oriented to person, place, and time.  Tired appearing, NAD, elderly male  HENT:  Head: Normocephalic and atraumatic.  Eyes: Pupils are equal, round, and reactive to light. Conjunctivae are normal. Right eye exhibits no discharge.  Mild bilateral proptosis, EOM slow   Neck: Normal range of motion. Neck supple. No thyromegaly present.  Cardiovascular:  Distant heart sounds, no obvious murmurs or extra heart sounds  Pulmonary/Chest: Effort normal and breath sounds normal. No respiratory distress. He has no wheezes.  Abdominal: Soft. Bowel sounds are normal. He exhibits distension.  Musculoskeletal: Normal range of motion. He exhibits edema (bilateral 2+ pitting edema to mid shins).  Neurological: He is alert and oriented to person, place, and time.  CN: 2-12 grossly  intact Strength: RUE and RLE 2/5, LUE and LLE 4/5.  Sensory: Intact to light touch in upper and lower extremities Reflexes: 2+ symmetric  Skin: Skin is warm and dry. No erythema.  Psychiatric: Mood and affect normal.   EKG: personally reviewed my interpretation is normal sinus rhythm, prolonged PR interval, minimal ST elevations in leads V2-V4, some LAE  CXR: personally reviewed my interpretation is no acute cardiopulmonary findings, port in place, mild cardiomegaly  CT head: IMPRESSION: Moderate atrophy and chronic ischemic change.  No acute infarct  12 mm right parietal low to intermediate density fluid collection most likely a chronic subdural hematoma. No midline shift  Hyperdense material throughout the left middle meningeal artery most likely embolic material. Correlate with history.   Assessment & Plan by Problem: This is a 67 year old male with history of diabetes type 2, end-stage renal disease on hemodialysis, subdural hematoma, CVA with right sided residual weakness, atrial fibrillation on Coumadin, gout, GERD, hyperlipidemia, hypertension, adjustment disorder, and generalized anxiety disorder who presented with worsening right-sided weakness and hypotension.  Hypotension likely secondary to polypharmacy: He is having systolic blood pressures down to 62/48.  Patient has a history of CVA with residual right-sided weakness and his hypotension may be worsening his symptoms.  He is currently on multiple medications that affect his blood pressure including metoprolol, clonidine, nifedipine, pain, hydralazine, and opioids.  He does appear to have some decreased perfusion with some mental status changes and decreased urinary output however he does have end-stage renal disease. He also has had warm skin with good circulation and no chest pain. He did have some decreased heart sounds but a POCUS showed no pericardial effusion. Likely benefit from admission with IV fluids to increase his  blood pressure and improve perfusion to his vital organs. -Holding carvedilol, clonidine, hydralazine, amlodipine, and nifedipine for now -Continue flecainide -Continue IV normal saline at 75 cc/hr -Continues pulse oximetry -Telemetry -CBC in AM  Altered mental status: This is likely 2/2 to his hypotension worsening his previous CVA symptoms. He reports that he has been sleepier lately and has been having more weakness compared to yesterday. He had a CT scan that showed no acute changes, just the chronic subdural hematoma.  -Modified stroke scale q2 hours for 12 hours  End-stage renal disease: Patient is on Tuesday,  Thursday, Saturday.  He received his dialysis yesterday with no complications. He does not appear fluid overload at this time. -On admission BUN 29, creatinine 4.9, potassium 5.1 bicarb 30.6 -Nephrology aware -May need HD during admission -Repeat BMP  Diabetes Mellitus type 2: Patient is on Levemir 10 units twice daily at home. Glucose on admission was 116. He is currently NPO. Will monitor glucose on CMPs.   Atrial fibrillation: Is not in atrial fibrillation on EKG, heart sounds were distant but appeared to be regular. He is on coumadin at home.  -Holding coumadin -Telemetry   Adjustment disorder and GAD: Reported on papers from SNF. Does not seem to be on any medications at this time. Will monitor for now.   Gout: Was on allopurinol, son reports that this was stopped due to his kidney function.  -Hold allopurinol  HLD: Continue home atorvastatin 40 mg daily  FEN: NS 75cc/hr, replete lytes prn, NPO VTE ppx: Heparin Code Status: FULL    Dispo: Admit patient to Inpatient with expected length of stay greater than 2 midnights.  Signed: Claudean Severance, MD 10/29/2017, 7:34 PM  Pager: [phone]

## 2017-10-29 NOTE — ED Triage Notes (Addendum)
Pt in via [facility] from [facility] with c/o worsened R side leg weakness, R leg drift present (started at 0900). Hx of CVA 1 mo ago with R side deficits. Pt a&ox3, keeps repeating, "can people live with this"? BP 90/50's for EMS. Gets dialysis TThS, last done yesterday.

## 2017-10-29 NOTE — ED Notes (Signed)
EDP aware of pts BP, plan is to finish Liter of fluid that is infusing and reassess.

## 2017-10-29 NOTE — ED Provider Notes (Signed)
Patient's pressures manually have been in the 60 automatic blood pressure cuff has some more in the low 70s.  These seem to be historically low for this patient may be secondary to hypertensive meds which need to be held may be secondary to too much fluid drawn off during dialysis yesterday.  Discussed with internal medicine he would be an unassigned admission and they will admit.  Dr. Sheran Spine told me that Dr. Chales Abrahams from nephrology was aware of the patient as well.  Patient not requiring dialysis today.  His normal dialysis days are Tuesday Thursdays and Saturdays.  Dr. Effie Shy had given patient 1 L of normal saline no significant improvement in blood pressures based on that.   Vanetta Mulders, MD 10/29/17 [phone]

## 2017-10-29 NOTE — ED Notes (Signed)
ED Provider at bedside. 

## 2017-10-30 DIAGNOSIS — N186 End stage renal disease: Secondary | ICD-10-CM | POA: Diagnosis present

## 2017-10-30 DIAGNOSIS — I6203 Nontraumatic chronic subdural hemorrhage: Secondary | ICD-10-CM | POA: Diagnosis present

## 2017-10-30 DIAGNOSIS — I959 Hypotension, unspecified: Secondary | ICD-10-CM | POA: Diagnosis not present

## 2017-10-30 DIAGNOSIS — I480 Paroxysmal atrial fibrillation: Secondary | ICD-10-CM | POA: Diagnosis present

## 2017-10-30 DIAGNOSIS — E1122 Type 2 diabetes mellitus with diabetic chronic kidney disease: Secondary | ICD-10-CM | POA: Diagnosis not present

## 2017-10-30 DIAGNOSIS — I120 Hypertensive chronic kidney disease with stage 5 chronic kidney disease or end stage renal disease: Secondary | ICD-10-CM | POA: Diagnosis not present

## 2017-10-30 DIAGNOSIS — I69351 Hemiplegia and hemiparesis following cerebral infarction affecting right dominant side: Secondary | ICD-10-CM

## 2017-10-30 LAB — CBC
HCT: 29.8 % — ABNORMAL LOW (ref 39.0–52.0)
Hemoglobin: 8.9 g/dL — ABNORMAL LOW (ref 13.0–17.0)
MCH: 24.9 pg — AB (ref 26.0–34.0)
MCHC: 29.9 g/dL — AB (ref 30.0–36.0)
MCV: 83.5 fL (ref 80.0–100.0)
Platelets: 209 10*3/uL (ref 150–400)
RBC: 3.57 MIL/uL — ABNORMAL LOW (ref 4.22–5.81)
RDW: 17.3 % — AB (ref 11.5–15.5)
WBC: 9.8 10*3/uL (ref 4.0–10.5)
nRBC: 0 % (ref 0.0–0.2)

## 2017-10-30 LAB — COMPREHENSIVE METABOLIC PANEL
ALK PHOS: 79 U/L (ref 38–126)
ALT: 9 U/L (ref 0–44)
AST: 19 U/L (ref 15–41)
Albumin: 2.1 g/dL — ABNORMAL LOW (ref 3.5–5.0)
Anion gap: 10 (ref 5–15)
BILIRUBIN TOTAL: 0.4 mg/dL (ref 0.3–1.2)
BUN: 35 mg/dL — AB (ref 8–23)
CALCIUM: 8.3 mg/dL — AB (ref 8.9–10.3)
CO2: 25 mmol/L (ref 22–32)
CREATININE: 5.08 mg/dL — AB (ref 0.61–1.24)
Chloride: 103 mmol/L (ref 98–111)
GFR calc Af Amer: 12 mL/min — ABNORMAL LOW (ref >60)
GFR, EST NON AFRICAN AMERICAN: 11 mL/min — AB (ref >60)
Glucose, Bld: 120 mg/dL — ABNORMAL HIGH (ref 70–99)
Potassium: 5 mmol/L (ref 3.5–5.1)
Sodium: 138 mmol/L (ref 135–145)
TOTAL PROTEIN: 5.5 g/dL — AB (ref 6.5–8.1)

## 2017-10-30 LAB — GLUCOSE, CAPILLARY
GLUCOSE-CAPILLARY: 109 mg/dL — AB (ref 70–99)
Glucose-Capillary: 117 mg/dL — ABNORMAL HIGH (ref 70–99)

## 2017-10-30 MED ORDER — INSULIN ASPART 100 UNIT/ML ~~LOC~~ SOLN: 0.0000 [IU] | Freq: Every day | SUBCUTANEOUS | Status: DC

## 2017-10-30 MED ORDER — INSULIN DETEMIR 100 UNIT/ML ~~LOC~~ SOLN
10.0000 [IU] | Freq: Every day | SUBCUTANEOUS | Status: DC
  Administered 2017-10-30: 10 [IU] via SUBCUTANEOUS
  Filled 2017-10-30: qty 0.1

## 2017-10-30 MED ORDER — INSULIN ASPART 100 UNIT/ML ~~LOC~~ SOLN: 0.0000 [IU] | Freq: Three times a day (TID) | SUBCUTANEOUS | Status: DC

## 2017-10-30 NOTE — Care Management Obs Status (Signed)
MEDICARE OBSERVATION STATUS NOTIFICATION   Patient Details  Name: Guy Fry MRN: [id number] Date of Birth: 11-24-1950   Medicare Observation Status Notification Given:  Yes    Epifanio Lesches, RN 10/30/2017, 3:24 PM

## 2017-10-30 NOTE — Clinical Social Work Note (Signed)
Clinical Social Work Assessment  Patient Details  Name: Guy Fry MRN: [id number] Date of Birth: [birth year]  Date of referral:  10/30/17               Reason for consult:  Discharge Planning                Permission sought to share information with:  Facility Medical sales representative, Family Supports Permission granted to share information::  Yes, Verbal Permission Granted  Name::     Research scientist (medical)::  [facility]  Relationship::     Contact Information:  [phone]  Housing/Transportation Living arrangements for the past 2 months:  Skilled Nursing Facility Source of Information:  Patient Patient Interpreter Needed:  None Criminal Activity/Legal Involvement Pertinent to Current Situation/Hospitalization:  No - Comment as needed Significant Relationships:  Other Family Members Lives with:  Facility Resident Do you feel safe going back to the place where you live?  Yes Need for family participation in patient care:  No (Coment)  Care giving concerns:  CSW received consult regarding discharge planning. Patient has been at [facility] for rehab and will return at discharge. Family member at bedside. CSW to continue to follow and assist with discharge planning needs.   Social Worker assessment / plan:  CSW spoke with patient concerning possibility of return to rehab at [facility] before returning home.  Employment status:  Retired Health and safety inspector:  Medicare PT Recommendations:  Not assessed at this time Information / Referral to community resources:  Skilled Nursing Facility  Patient/Family's Response to care:  Patient recognizes need for rehab before returning home and is agreeable to return to [facility] by [facility].   Patient/Family's Understanding of and Emotional Response to Diagnosis, Current Treatment, and Prognosis:  Patient/family is realistic regarding therapy needs and expressed being hopeful for SNF placement. Patient expressed understanding of  CSW role and discharge process as well as medical condition. No questions/concerns about plan or treatment.    Emotional Assessment Appearance:  Appears stated age Attitude/Demeanor/Rapport:  Gracious Affect (typically observed):  Accepting, Appropriate Orientation:  Oriented to Self, Oriented to Place, Oriented to  Time, Oriented to Situation Alcohol / Substance use:  Not Applicable Psych involvement (Current and /or in the community):  No (Comment)  Discharge Needs  Concerns to be addressed:  Care Coordination Readmission within the last 30 days:  No Current discharge risk:  None Barriers to Discharge:  No Barriers Identified   Mearl Latin, LCSW 10/30/2017, 1:18 PM

## 2017-10-30 NOTE — Progress Notes (Signed)
Guy Fry to be D/C'd to [facility] per MD order.  Discussed with the patient and all questions fully answered.  VSS, Skin clean, dry and intact without evidence of skin break down, no evidence of skin tears noted. IV catheter discontinued intact. Site without signs and symptoms of complications. Dressing and pressure applied.  An After Visit Summary was printed and given to the patient.  D/c education completed with patient/family including follow up instructions, medication list, d/c activities limitations if indicated, with other d/c instructions as indicated by MD - patient able to verbalize understanding, all questions fully answered.   Patient instructed to return to ED, call 911, or call MD for any changes in condition.   Patient escorted via stretcher, and D/C to SNF via [facility].  Joellyn Haff Price 10/30/2017 3:09 PM

## 2017-10-30 NOTE — Progress Notes (Signed)
   Subjective: Patient reports that he has been doing well today, no acute events overnight.  He reports that he has been more alert and awake.  He does report that overall reports to feel better compared to yesterday.  He denies any lightheadedness confusion or dizziness today.  He reports that his right arm strength right leg strength has improved.  We discussed that we will likely be holding most of his medications when he is discharged due to likely causing his hypotension.  He is in agreement with this plan.  Objective:  Vital signs in last 24 hours: Vitals:   10/29/17 1930 10/29/17 1945 10/29/17 2000 10/30/17 0010  BP: (!) 82/55 (!) 77/53  104/65  Pulse: (!) 58 60  70  Resp: (!) 22 20  (!) 21  Temp:   97.9 F (36.6 C) 98.4 F (36.9 C)  TempSrc:   Oral Oral  SpO2: 98% 96%  96%  Weight:      Height:        General: Well nourished, alert, elderly male, NAD  Cardiac: RRR, normal S1, S2, no murmurs, rubs or gallops  Pulmonary: Lungs CTA bilaterally, no wheezing, rhonchi or rales  Abdomen: Soft, non-tender, +bowel sounds, no guarding or masses noted Extremity: Bilateral 2+ pitting edema, no muscle atrophy, no lesions or wounds noted Neuro: Alert and oriented x3, strength:right upper and right lower extremity 3+/5, left upper and left lower extremity 4/5, reflexes 2+ and symmetric Psychiatry: Normal mood and affect     Assessment/Plan:  Principal Problem:   Hypotension This is a 67 year old male with history of diabetes type 2, end-stage renal disease on hemodialysis, subdural hematoma, CVA with right sided residual weakness, atrial fibrillation on Coumadin, gout, GERD, hyperlipidemia, hypertension, adjustment disorder, and generalized anxiety disorder who presented with worsening right-sided weakness and hypotension.  Altered mental status secondary to hypotension secondary to polypharmacy: Patient was on multiple medications that alters his blood pressure including carvedilol,  clonidine, hydralazine, amlodipine, nifedipine, and Norco.  Blood pressure was down to 62/48.  Given his history of subdural hematoma and CVA his hypotension likely exacerbated his residual symptoms.  Blood pressure has currently improved after holding his medications and is stable around 120/80.  He also reports improvement in his sleepiness and right extremity weakness.  On physical exam this is also improved.  He will likely be stable for discharge today. -Hold carvedilol, clonidine, hydralazine, amlodipine, and nifedipine on discharge -Continue flecainide -Discontinue IV fluids and telemetry  End-stage renal disease: Patient is on Tuesday, Thursday, Saturday.  He received his dialysis on 10/12 with no complications. He does not appear fluid overload at this time. -On admission BUN 29, creatinine 4.9, potassium 5.1 bicarb 30.6 -Nephrology aware -BMP today showed BUN 35, creatinine 5.08, potassium 5, bicarb 25 -Continue dialysis in outpatient setting  Atrial fibrillation: Not currently in this rhythm.  He has been on Coumadin but this was held to subdural hematoma in July.  We will not restart at this time.  Diabetes Mellitus type 2: Patient is on Levemir 10 units twice daily at home. Glucose on admission was 116. He is currently NPO.  Glucose today was 120. -Resume home regimen on discharge  FEN: No fluids, replete lytes prn, regular diet  VTE ppx: Heparin Code Status: FULL    Dispo: Anticipated discharge in approximately today.   Claudean Severance, MD 10/30/2017, 6:20 AM Pager: [phone]

## 2017-10-30 NOTE — Progress Notes (Signed)
Patient will DC to: [facility] Anticipated DC date: 10/30/17 Family notified: Family at bedside Transport by: Sharin Mons   Per MD patient ready for DC to [facility]. RN, patient, patient's family, and facility notified of DC. Discharge Summary and FL2 sent to facility. RN to call report prior to discharge [phone]). DC packet on chart. Ambulance transport requested for patient.   CSW will sign off for now as social work intervention is no longer needed. Please consult [place] again if new needs arise.  Cristobal Goldmann, LCSW Clinical Social Worker [phone]

## 2017-10-30 NOTE — [place] FL2 (Signed)
  [place] MEDICAID FL2 LEVEL OF CARE SCREENING TOOL     IDENTIFICATION  Patient Name: Guy Fry Birthdate: 03/31/1950 Sex: male Admission Date (Current Location): 10/29/2017  [facility] and [place] Number:  Producer, television/film/video and Address:  [employer] [place]. [facility], [address], [place], [place] [address]      Provider Number: [id number]  Attending Physician Name and Address:  Gust Rung, DO  Relative Name and Phone Number:  Nelma Rothman [phone]    Current Level of Care: Hospital Recommended Level of Care: Skilled Nursing Facility Prior Approval Number:    Date Approved/Denied:   PASRR Number:    Discharge Plan: SNF    Current Diagnoses: Patient Active Problem List   Diagnosis Date Noted  . Hypotension 10/29/2017    Orientation RESPIRATION BLADDER Height & Weight     Time, Self, Situation, Place  Normal Continent Weight: 90.7 kg Height:  5\' 7"  (170.2 cm)  BEHAVIORAL SYMPTOMS/MOOD NEUROLOGICAL BOWEL NUTRITION STATUS      Continent Diet(Please see DC Summary)  AMBULATORY STATUS COMMUNICATION OF NEEDS Skin   Limited Assist Verbally Normal                       Personal Care Assistance Level of Assistance  Bathing, Dressing, Feeding Bathing Assistance: Limited assistance Feeding assistance: Limited assistance Dressing Assistance: Limited assistance     Functional Limitations Info  Sight, Hearing, Speech Sight Info: Adequate Hearing Info: Adequate Speech Info: Adequate    SPECIAL CARE FACTORS FREQUENCY  PT (By licensed PT), OT (By licensed OT)     PT Frequency: 5x/week OT Frequency: 3x/week            Contractures Contractures Info: Not present    Additional Factors Info  Code Status, Allergies, Insulin Sliding Scale Code Status Info: Full Allergies Info: Allergies:  Lisinopril   Insulin Sliding Scale Info: 3x daily with meals       Current Medications (10/30/2017):  This is the current hospital active medication  list Current Facility-Administered Medications  Medication Dose Route Frequency Provider Last Rate Last Dose  . acetaminophen (TYLENOL) tablet 650 mg  650 mg Oral Q6H PRN Levora Dredge, MD       Or  . acetaminophen (TYLENOL) suppository 650 mg  650 mg Rectal Q6H PRN Helberg, Justin, MD      . atorvastatin (LIPITOR) tablet 40 mg  40 mg Oral Daily Helberg, Justin, MD   40 mg at 10/30/17 1030  . flecainide (TAMBOCOR) tablet 100 mg  100 mg Oral BID Levora Dredge, MD   100 mg at 10/30/17 1030  . heparin injection 5,000 Units  5,000 Units Subcutaneous Q8H Levora Dredge, MD   5,000 Units at 10/30/17 0629  . insulin aspart (novoLOG) injection 0-5 Units  0-5 Units Subcutaneous QHS Helberg, Justin, MD      . insulin aspart (novoLOG) injection 0-9 Units  0-9 Units Subcutaneous TID WC Helberg, Justin, MD      . insulin detemir (LEVEMIR) injection 10 Units  10 Units Subcutaneous Daily Levora Dredge, MD   10 Units at 10/30/17 1030     Discharge Medications: Please see discharge summary for a list of discharge medications.  Relevant Imaging Results:  Relevant Lab Results:   Additional Information SSN: [id number] [place], [place]

## 2017-10-30 NOTE — Discharge Instructions (Signed)
Guy Fry,   It has been a pleasure working with you and we are glad you're feeling better. You were hospitalized for low blood pressure (hypotension) and right sided weakness.  We think this was related to your multiple blood pressure medications and we held those while you were in the hospital.   For your hypotension,  Please stop taking the amlodipine, carvedilol, clonidine, hydralazine, and nifedipine for now.  Please follow-up with your primary care provider to discuss which medications you should restart.  Follow up with your primary care provider in 1-2 weeks  If your symptoms worsen or you develop new symptoms, please seek medical help whether it is your primary care provider or emergency department.  If you have any questions about this hospitalization please call [phone].

## 2017-10-30 NOTE — Discharge Summary (Signed)
Name: Guy Fry MRN: [id number] DOB: Feb 27, 1950 67 y.o. PCP: Eloisa Northern, MD  Date of Admission: 10/29/2017 10:37 AM Date of Discharge: 10/30/17 Attending Physician: Gust Rung, DO  Discharge Diagnosis: 1. Hypotension 2/2 Polypharmacy  Discharge Medications: Allergies as of 10/30/2017      Reactions   Lisinopril       Medication List    STOP taking these medications   amLODipine 10 MG tablet Commonly known as:  NORVASC   carvedilol 25 MG tablet Commonly known as:  COREG   cloNIDine 0.3 MG tablet Commonly known as:  CATAPRES   hydrALAZINE 25 MG tablet Commonly known as:  APRESOLINE   NIFEdipine 90 MG 24 hr tablet Commonly known as:  PROCARDIA XL/NIFEDICAL-XL     TAKE these medications   acetaminophen 650 MG CR tablet Commonly known as:  TYLENOL Take 650 mg by mouth every 4 (four) hours as needed for pain.   allopurinol 100 MG tablet Commonly known as:  ZYLOPRIM Take 100 mg by mouth daily.   aspirin EC 81 MG tablet Take 81 mg by mouth daily.   atorvastatin 40 MG tablet Commonly known as:  LIPITOR Take 40 mg by mouth daily.   calcium acetate 667 MG capsule Commonly known as:  PHOSLO Take 1,334 mg by mouth 2 (two) times daily with a meal.   colchicine 0.6 MG tablet Take 0.6 mg by mouth daily.   eucerin cream Apply 1 application topically daily. Apply to dry flaky skin   flecainide 100 MG tablet Commonly known as:  TAMBOCOR Take 100 mg by mouth 2 (two) times daily.   gabapentin 100 MG capsule Commonly known as:  NEURONTIN Take 100 mg by mouth 2 (two) times daily.   HYDROcodone-acetaminophen 5-325 MG tablet Commonly known as:  NORCO/VICODIN Take 1 tablet by mouth every 6 (six) hours as needed for moderate pain.   insulin detemir 100 UNIT/ML injection Commonly known as:  LEVEMIR Inject 10 Units into the skin every 12 (twelve) hours.   ketoconazole 2 % cream Commonly known as:  NIZORAL Apply 1 application topically daily.     minoxidil 2.5 MG tablet Commonly known as:  LONITEN Take 5 mg by mouth 2 (two) times daily.   NON FORMULARY Apply 1 application topically 3 (three) times daily. Dermacloud  Cream  Use every shift change on buttocks to prevent breakdown   omeprazole 20 MG capsule Commonly known as:  PRILOSEC Take 20 mg by mouth daily.   polyethylene glycol packet Commonly known as:  MIRALAX / GLYCOLAX Take 17 g by mouth daily.   sennosides-docusate sodium 8.6-50 MG tablet Commonly known as:  SENOKOT-S Take 2 tablets by mouth 2 (two) times daily.      Disposition and follow-up:   Guy Fry was discharged from [facility] in Stable condition.  At the hospital follow up visit please address:  1.  HTN. The patient's carvedilol, clonidine, hydralazine, amlodipine, and nifedipine were all held during this admission due to hypertension. We discontinued these on discharge. Please continue to monitor the patient's blood pressure and assess the need for antihypertensive.  Atrial fibrillation. We continued the patient's flecainide; however, it is unclear if the patient was also on carvedilol and nifedipine for rate control of his atrial fibrillation. He is currently in sinus rhythm but would benefit from cardiology outpatient follow-up to assess the need for other medications.  2.  Labs / imaging needed at time of follow-up: None  3.  Pending labs/  test needing follow-up: None  Hospital Course by problem list:  Hypotension 2/2 Polypharmacy. Guy Fry is a 67 year old male with a history of chronic subdural hematoma, CVA with residual right sided deficits, ESRD, atrial fibrillation, and hypertension who presented to the emergency department on 10/13 with worsening right-sided weakness, altered mental status, and hypotension. Extensive workup revealed no signs sepsis or obstructive shock. His medications were reviewed he was noted to be on carvedilol, clonidine, hydralazine,  amlodipine, and nifedipine. These medications were discontinued and he was given a 1 L normal saline bolus and started on 75 mL per hour maintenance fluid. Over the course of his hospitalization his blood pressure significantly improved. On discharge the patient's carvedilol, clonidine, hydralazine, amlodipine, and nifedipine were all held. We recommend reevaluating the need for his antihypertensives and slowly adding back medications as they are needed. He would benefit from cardiology referral to discuss treatment for his atrial fibrillation.  Discharge Vitals:   BP 121/68   Pulse 73   Temp 97.9 F (36.6 C) (Oral)   Resp 16   Ht 5\' 7"  (1.702 m)   Wt 90.7 kg   SpO2 97%   BMI 31.32 kg/m   Discharge Instructions: Discharge Instructions    Call MD for:  difficulty breathing, headache or visual disturbances   Complete by:  As directed    Call MD for:  hives   Complete by:  As directed    Call MD for:  persistant dizziness or light-headedness   Complete by:  As directed    Call MD for:  persistant nausea and vomiting   Complete by:  As directed    Call MD for:  redness, tenderness, or signs of infection (pain, swelling, redness, odor or green/yellow discharge around incision site)   Complete by:  As directed    Call MD for:  severe uncontrolled pain   Complete by:  As directed    Call MD for:  temperature >100.4   Complete by:  As directed    Diet - low sodium heart healthy   Complete by:  As directed    Increase activity slowly   Complete by:  As directed       Signed: Claudean Severance, MD 10/30/2017, 1:07 PM

## 2017-10-30 NOTE — Progress Notes (Signed)
   10/30/17 1100  Clinical Encounter Type  Visited With Patient and family together  Visit Type Initial  Referral From Nurse  Stress Factors  Patient Stress Factors None identified  Family Stress Factors None identified   Responded to spiritual consult. PT was alert and family member was at beside. PT was wanting more information on the AD. I gave them a copy and brief overview of how to fill it out. PT and family will notify when PT is ready for signing. Chaplain available as needed.   Chaplain Orest Dikes [phone]

## 2017-10-30 NOTE — Care Management CC44 (Signed)
Condition Code 44 Documentation Completed  Patient Details  Name: Guy Fry MRN: [id number] Date of Birth: Aug 03, 1950   Condition Code 44 given:  Yes Patient signature on Condition Code 44 notice:  Yes Documentation of 2 MD's agreement:  Yes Code 44 added to claim:  Yes    Epifanio Lesches, RN 10/30/2017, 3:24 PM

## 2017-10-31 LAB — HIV ANTIBODY (ROUTINE TESTING W REFLEX): HIV SCREEN 4TH GENERATION: NONREACTIVE

## 2017-11-04 ENCOUNTER — Inpatient Hospital Stay (HOSPITAL_COMMUNITY): Payer: Medicare Other

## 2017-11-04 ENCOUNTER — Encounter (HOSPITAL_COMMUNITY): Payer: Self-pay

## 2017-11-04 ENCOUNTER — Inpatient Hospital Stay (HOSPITAL_COMMUNITY)
Admission: EM | Admit: 2017-11-04 | Discharge: 2017-11-17 | DRG: 682 | Disposition: E | Payer: Medicare Other | Attending: Internal Medicine | Admitting: Internal Medicine

## 2017-11-04 ENCOUNTER — Emergency Department (HOSPITAL_COMMUNITY): Payer: Medicare Other

## 2017-11-04 ENCOUNTER — Other Ambulatory Visit: Payer: Self-pay

## 2017-11-04 DIAGNOSIS — I480 Paroxysmal atrial fibrillation: Secondary | ICD-10-CM | POA: Diagnosis present

## 2017-11-04 DIAGNOSIS — I6340 Cerebral infarction due to embolism of unspecified cerebral artery: Secondary | ICD-10-CM

## 2017-11-04 DIAGNOSIS — Z992 Dependence on renal dialysis: Secondary | ICD-10-CM

## 2017-11-04 DIAGNOSIS — D631 Anemia in chronic kidney disease: Secondary | ICD-10-CM | POA: Diagnosis present

## 2017-11-04 DIAGNOSIS — Z8249 Family history of ischemic heart disease and other diseases of the circulatory system: Secondary | ICD-10-CM

## 2017-11-04 DIAGNOSIS — L8990 Pressure ulcer of unspecified site, unspecified stage: Secondary | ICD-10-CM | POA: Diagnosis present

## 2017-11-04 DIAGNOSIS — Z66 Do not resuscitate: Secondary | ICD-10-CM | POA: Diagnosis not present

## 2017-11-04 DIAGNOSIS — Z87891 Personal history of nicotine dependence: Secondary | ICD-10-CM

## 2017-11-04 DIAGNOSIS — E785 Hyperlipidemia, unspecified: Secondary | ICD-10-CM | POA: Diagnosis present

## 2017-11-04 DIAGNOSIS — E8770 Fluid overload, unspecified: Secondary | ICD-10-CM | POA: Diagnosis not present

## 2017-11-04 DIAGNOSIS — D689 Coagulation defect, unspecified: Secondary | ICD-10-CM | POA: Diagnosis present

## 2017-11-04 DIAGNOSIS — E1122 Type 2 diabetes mellitus with diabetic chronic kidney disease: Secondary | ICD-10-CM | POA: Diagnosis present

## 2017-11-04 DIAGNOSIS — I6203 Nontraumatic chronic subdural hemorrhage: Secondary | ICD-10-CM | POA: Diagnosis not present

## 2017-11-04 DIAGNOSIS — I472 Ventricular tachycardia: Secondary | ICD-10-CM | POA: Diagnosis present

## 2017-11-04 DIAGNOSIS — N186 End stage renal disease: Secondary | ICD-10-CM | POA: Diagnosis present

## 2017-11-04 DIAGNOSIS — Z9911 Dependence on respirator [ventilator] status: Secondary | ICD-10-CM

## 2017-11-04 DIAGNOSIS — J9600 Acute respiratory failure, unspecified whether with hypoxia or hypercapnia: Secondary | ICD-10-CM | POA: Diagnosis not present

## 2017-11-04 DIAGNOSIS — I82431 Acute embolism and thrombosis of right popliteal vein: Secondary | ICD-10-CM | POA: Diagnosis present

## 2017-11-04 DIAGNOSIS — R579 Shock, unspecified: Secondary | ICD-10-CM | POA: Diagnosis present

## 2017-11-04 DIAGNOSIS — I63412 Cerebral infarction due to embolism of left middle cerebral artery: Secondary | ICD-10-CM | POA: Diagnosis not present

## 2017-11-04 DIAGNOSIS — Z515 Encounter for palliative care: Secondary | ICD-10-CM | POA: Diagnosis not present

## 2017-11-04 DIAGNOSIS — I63132 Cerebral infarction due to embolism of left carotid artery: Secondary | ICD-10-CM | POA: Diagnosis not present

## 2017-11-04 DIAGNOSIS — Z978 Presence of other specified devices: Secondary | ICD-10-CM | POA: Diagnosis not present

## 2017-11-04 DIAGNOSIS — R9401 Abnormal electroencephalogram [EEG]: Secondary | ICD-10-CM | POA: Diagnosis present

## 2017-11-04 DIAGNOSIS — I82441 Acute embolism and thrombosis of right tibial vein: Secondary | ICD-10-CM | POA: Diagnosis not present

## 2017-11-04 DIAGNOSIS — I468 Cardiac arrest due to other underlying condition: Secondary | ICD-10-CM | POA: Diagnosis present

## 2017-11-04 DIAGNOSIS — Z6828 Body mass index (BMI) 28.0-28.9, adult: Secondary | ICD-10-CM

## 2017-11-04 DIAGNOSIS — I469 Cardiac arrest, cause unspecified: Secondary | ICD-10-CM | POA: Diagnosis present

## 2017-11-04 DIAGNOSIS — E875 Hyperkalemia: Secondary | ICD-10-CM

## 2017-11-04 DIAGNOSIS — G9341 Metabolic encephalopathy: Secondary | ICD-10-CM | POA: Diagnosis not present

## 2017-11-04 DIAGNOSIS — Z9289 Personal history of other medical treatment: Secondary | ICD-10-CM

## 2017-11-04 DIAGNOSIS — I4901 Ventricular fibrillation: Secondary | ICD-10-CM | POA: Diagnosis present

## 2017-11-04 DIAGNOSIS — Z79899 Other long term (current) drug therapy: Secondary | ICD-10-CM

## 2017-11-04 DIAGNOSIS — E669 Obesity, unspecified: Secondary | ICD-10-CM | POA: Diagnosis present

## 2017-11-04 DIAGNOSIS — R34 Anuria and oliguria: Secondary | ICD-10-CM | POA: Diagnosis not present

## 2017-11-04 DIAGNOSIS — E11649 Type 2 diabetes mellitus with hypoglycemia without coma: Secondary | ICD-10-CM | POA: Diagnosis present

## 2017-11-04 DIAGNOSIS — G931 Anoxic brain damage, not elsewhere classified: Secondary | ICD-10-CM | POA: Diagnosis not present

## 2017-11-04 DIAGNOSIS — I69351 Hemiplegia and hemiparesis following cerebral infarction affecting right dominant side: Secondary | ICD-10-CM

## 2017-11-04 DIAGNOSIS — I120 Hypertensive chronic kidney disease with stage 5 chronic kidney disease or end stage renal disease: Principal | ICD-10-CM | POA: Diagnosis present

## 2017-11-04 DIAGNOSIS — Z87440 Personal history of urinary (tract) infections: Secondary | ICD-10-CM

## 2017-11-04 DIAGNOSIS — I82409 Acute embolism and thrombosis of unspecified deep veins of unspecified lower extremity: Secondary | ICD-10-CM

## 2017-11-04 DIAGNOSIS — Z7982 Long term (current) use of aspirin: Secondary | ICD-10-CM

## 2017-11-04 DIAGNOSIS — Z794 Long term (current) use of insulin: Secondary | ICD-10-CM

## 2017-11-04 DIAGNOSIS — R609 Edema, unspecified: Secondary | ICD-10-CM | POA: Diagnosis not present

## 2017-11-04 DIAGNOSIS — N390 Urinary tract infection, site not specified: Secondary | ICD-10-CM | POA: Diagnosis not present

## 2017-11-04 DIAGNOSIS — S065X9A Traumatic subdural hemorrhage with loss of consciousness of unspecified duration, initial encounter: Secondary | ICD-10-CM | POA: Diagnosis not present

## 2017-11-04 DIAGNOSIS — R9082 White matter disease, unspecified: Secondary | ICD-10-CM | POA: Diagnosis present

## 2017-11-04 DIAGNOSIS — G9340 Encephalopathy, unspecified: Secondary | ICD-10-CM | POA: Diagnosis not present

## 2017-11-04 DIAGNOSIS — Z888 Allergy status to other drugs, medicaments and biological substances status: Secondary | ICD-10-CM

## 2017-11-04 LAB — I-STAT CHEM 8, ED
BUN: 60 mg/dL — AB (ref 8–23)
CALCIUM ION: 1.47 mmol/L — AB (ref 1.15–1.40)
Chloride: 105 mmol/L (ref 98–111)
Creatinine, Ser: 5.7 mg/dL — ABNORMAL HIGH (ref 0.61–1.24)
Glucose, Bld: 150 mg/dL — ABNORMAL HIGH (ref 70–99)
HEMATOCRIT: 26 % — AB (ref 39.0–52.0)
HEMOGLOBIN: 8.8 g/dL — AB (ref 13.0–17.0)
Potassium: 6.2 mmol/L — ABNORMAL HIGH (ref 3.5–5.1)
SODIUM: 141 mmol/L (ref 135–145)
TCO2: 28 mmol/L (ref 22–32)

## 2017-11-04 LAB — HEPATIC FUNCTION PANEL
ALT: 13 U/L (ref 0–44)
AST: 42 U/L — AB (ref 15–41)
Albumin: 1.4 g/dL — ABNORMAL LOW (ref 3.5–5.0)
Alkaline Phosphatase: 97 U/L (ref 38–126)
BILIRUBIN DIRECT: 0.2 mg/dL (ref 0.0–0.2)
BILIRUBIN TOTAL: 0.4 mg/dL (ref 0.3–1.2)
Indirect Bilirubin: 0.2 mg/dL — ABNORMAL LOW (ref 0.3–0.9)
Total Protein: 5.4 g/dL — ABNORMAL LOW (ref 6.5–8.1)

## 2017-11-04 LAB — I-STAT ARTERIAL BLOOD GAS, ED
Acid-Base Excess: 3 mmol/L — ABNORMAL HIGH (ref 0.0–2.0)
Bicarbonate: 29.4 mmol/L — ABNORMAL HIGH (ref 20.0–28.0)
O2 SAT: 99 %
PH ART: 7.355 (ref 7.350–7.450)
PO2 ART: 128 mmHg — AB (ref 83.0–108.0)
TCO2: 31 mmol/L (ref 22–32)
pCO2 arterial: 52.9 mmHg — ABNORMAL HIGH (ref 32.0–48.0)

## 2017-11-04 LAB — PROTIME-INR
INR: 1.24
INR: 1.18
INR: 1.23
PROTHROMBIN TIME: 15.5 s — AB (ref 11.4–15.2)
PROTHROMBIN TIME: 15.3 s — AB (ref 11.4–15.2)
Prothrombin Time: 14.9 seconds (ref 11.4–15.2)

## 2017-11-04 LAB — RENAL FUNCTION PANEL
Albumin: 1.7 g/dL — ABNORMAL LOW (ref 3.5–5.0)
Anion gap: 10 (ref 5–15)
BUN: 63 mg/dL — AB (ref 8–23)
CHLORIDE: 103 mmol/L (ref 98–111)
CO2: 28 mmol/L (ref 22–32)
CREATININE: 5.94 mg/dL — AB (ref 0.61–1.24)
Calcium: 10.2 mg/dL (ref 8.9–10.3)
GFR calc Af Amer: 10 mL/min — ABNORMAL LOW (ref >60)
GFR calc non Af Amer: 9 mL/min — ABNORMAL LOW (ref >60)
GLUCOSE: 133 mg/dL — AB (ref 70–99)
Phosphorus: 8.6 mg/dL — ABNORMAL HIGH (ref 2.5–4.6)
Potassium: 5.5 mmol/L — ABNORMAL HIGH (ref 3.5–5.1)
Sodium: 141 mmol/L (ref 135–145)

## 2017-11-04 LAB — POCT I-STAT, CHEM 8
BUN: 57 mg/dL — AB (ref 8–23)
CHLORIDE: 100 mmol/L (ref 98–111)
Calcium, Ion: 1.37 mmol/L (ref 1.15–1.40)
Creatinine, Ser: 6.4 mg/dL — ABNORMAL HIGH (ref 0.61–1.24)
Glucose, Bld: 130 mg/dL — ABNORMAL HIGH (ref 70–99)
HCT: 30 % — ABNORMAL LOW (ref 39.0–52.0)
Hemoglobin: 10.2 g/dL — ABNORMAL LOW (ref 13.0–17.0)
POTASSIUM: 5.5 mmol/L — AB (ref 3.5–5.1)
Sodium: 140 mmol/L (ref 135–145)
TCO2: 30 mmol/L (ref 22–32)

## 2017-11-04 LAB — URINALYSIS, ROUTINE W REFLEX MICROSCOPIC
Bacteria, UA: NONE SEEN
Bilirubin Urine: NEGATIVE
Glucose, UA: NEGATIVE mg/dL
Ketones, ur: NEGATIVE mg/dL
Nitrite: NEGATIVE
PROTEIN: 100 mg/dL — AB
Specific Gravity, Urine: 1.013 (ref 1.005–1.030)
pH: 8 (ref 5.0–8.0)

## 2017-11-04 LAB — CBG MONITORING, ED: GLUCOSE-CAPILLARY: 104 mg/dL — AB (ref 70–99)

## 2017-11-04 LAB — TROPONIN I
TROPONIN I: 0.08 ng/mL — AB (ref <0.03)
TROPONIN I: 0.21 ng/mL — AB (ref <0.03)
Troponin I: 0.23 ng/mL (ref <0.03)

## 2017-11-04 LAB — BASIC METABOLIC PANEL
Anion gap: 13 (ref 5–15)
BUN: 57 mg/dL — ABNORMAL HIGH (ref 8–23)
CHLORIDE: 105 mmol/L (ref 98–111)
CO2: 23 mmol/L (ref 22–32)
CREATININE: 5.58 mg/dL — AB (ref 0.61–1.24)
Calcium: 11.1 mg/dL — ABNORMAL HIGH (ref 8.9–10.3)
GFR calc non Af Amer: 10 mL/min — ABNORMAL LOW (ref >60)
GFR, EST AFRICAN AMERICAN: 11 mL/min — AB (ref >60)
Glucose, Bld: 153 mg/dL — ABNORMAL HIGH (ref 70–99)
POTASSIUM: 6 mmol/L — AB (ref 3.5–5.1)
SODIUM: 141 mmol/L (ref 135–145)

## 2017-11-04 LAB — CBC
HEMATOCRIT: 28.4 % — AB (ref 39.0–52.0)
HEMOGLOBIN: 8.7 g/dL — AB (ref 13.0–17.0)
MCH: 25.5 pg — ABNORMAL LOW (ref 26.0–34.0)
MCHC: 30.6 g/dL (ref 30.0–36.0)
MCV: 83.3 fL (ref 80.0–100.0)
NRBC: 0.1 % (ref 0.0–0.2)
Platelets: 263 10*3/uL (ref 150–400)
RBC: 3.41 MIL/uL — AB (ref 4.22–5.81)
RDW: 16.3 % — ABNORMAL HIGH (ref 11.5–15.5)
WBC: 25.2 10*3/uL — AB (ref 4.0–10.5)

## 2017-11-04 LAB — I-STAT TROPONIN, ED: Troponin i, poc: 0.08 ng/mL (ref 0.00–0.08)

## 2017-11-04 LAB — APTT
APTT: 42 s — AB (ref 24–36)
aPTT: 51 seconds — ABNORMAL HIGH (ref 24–36)

## 2017-11-04 LAB — MRSA PCR SCREENING: MRSA by PCR: POSITIVE — AB

## 2017-11-04 LAB — MAGNESIUM: MAGNESIUM: 1.9 mg/dL (ref 1.7–2.4)

## 2017-11-04 LAB — I-STAT CG4 LACTIC ACID, ED: Lactic Acid, Venous: 5.25 mmol/L (ref 0.5–1.9)

## 2017-11-04 MED ORDER — INSULIN ASPART 100 UNIT/ML ~~LOC~~ SOLN
10.0000 [IU] | Freq: Once | SUBCUTANEOUS | Status: AC
  Administered 2017-11-04: 10 [IU] via INTRAVENOUS

## 2017-11-04 MED ORDER — ALBUTEROL (5 MG/ML) CONTINUOUS INHALATION SOLN
10.0000 mg/h | INHALATION_SOLUTION | Freq: Once | RESPIRATORY_TRACT | Status: AC
  Administered 2017-11-04: 10 mg/h via RESPIRATORY_TRACT
  Filled 2017-11-04: qty 20

## 2017-11-04 MED ORDER — PRISMASOL BGK 0/2.5 32-2.5 MEQ/L IV SOLN
INTRAVENOUS | Status: DC
  Administered 2017-11-04 – 2017-11-05 (×5): via INTRAVENOUS_CENTRAL
  Filled 2017-11-04 (×17): qty 5000

## 2017-11-04 MED ORDER — HEPARIN SODIUM (PORCINE) 1000 UNIT/ML DIALYSIS
1000.0000 [IU] | INTRAMUSCULAR | Status: DC | PRN
  Administered 2017-11-07: 3000 [IU] via INTRAVENOUS_CENTRAL
  Filled 2017-11-04: qty 3
  Filled 2017-11-04 (×3): qty 6

## 2017-11-04 MED ORDER — SODIUM CHLORIDE 0.9 % IV SOLN: INTRAVENOUS | Status: DC

## 2017-11-04 MED ORDER — EPINEPHRINE PF 1 MG/10ML IJ SOSY
PREFILLED_SYRINGE | INTRAMUSCULAR | Status: AC | PRN
  Administered 2017-11-04: 1 mg via INTRAVENOUS

## 2017-11-04 MED ORDER — FAMOTIDINE IN NACL 20-0.9 MG/50ML-% IV SOLN
20.0000 mg | INTRAVENOUS | Status: DC
  Administered 2017-11-04 – 2017-11-08 (×5): 20 mg via INTRAVENOUS
  Filled 2017-11-04 (×5): qty 50

## 2017-11-04 MED ORDER — MIDAZOLAM HCL 2 MG/2ML IJ SOLN: 1.0000 mg | Freq: Once | INTRAMUSCULAR | Status: DC

## 2017-11-04 MED ORDER — FENTANYL BOLUS VIA INFUSION
25.0000 ug | INTRAVENOUS | Status: DC | PRN
  Filled 2017-11-04: qty 25

## 2017-11-04 MED ORDER — ARTIFICIAL TEARS OPHTHALMIC OINT
1.0000 "application " | TOPICAL_OINTMENT | Freq: Three times a day (TID) | OPHTHALMIC | Status: DC
  Administered 2017-11-04 – 2017-11-05 (×3): 1 via OPHTHALMIC
  Filled 2017-11-04: qty 3.5

## 2017-11-04 MED ORDER — SODIUM BICARBONATE 8.4 % IV SOLN
INTRAVENOUS | Status: AC | PRN
  Administered 2017-11-04 (×2): 100 meq via INTRAVENOUS

## 2017-11-04 MED ORDER — EPINEPHRINE PF 1 MG/10ML IJ SOSY
PREFILLED_SYRINGE | INTRAMUSCULAR | Status: AC | PRN
  Administered 2017-11-04: 0.5 mg via INTRAVENOUS

## 2017-11-04 MED ORDER — SODIUM CHLORIDE 0.9 % IV SOLN
250.0000 mL | INTRAVENOUS | Status: DC
  Administered 2017-11-04: 250 mL via INTRAVENOUS
  Administered 2017-11-05: 500 mL via INTRAVENOUS

## 2017-11-04 MED ORDER — CALCIUM CHLORIDE 10 % IV SOLN
INTRAVENOUS | Status: AC | PRN
  Administered 2017-11-04: 1 g via INTRAVENOUS

## 2017-11-04 MED ORDER — PRISMASOL BGK 0/2.5 32-2.5 MEQ/L IV SOLN
INTRAVENOUS | Status: DC
  Administered 2017-11-04 – 2017-11-05 (×6): via INTRAVENOUS_CENTRAL
  Filled 2017-11-04 (×2): qty 5000

## 2017-11-04 MED ORDER — NOREPINEPHRINE 4 MG/250ML-% IV SOLN
2.0000 ug/min | INTRAVENOUS | Status: DC
  Administered 2017-11-04: 5 ug/min via INTRAVENOUS

## 2017-11-04 MED ORDER — CISATRACURIUM BOLUS VIA INFUSION
0.0500 mg/kg | INTRAVENOUS | Status: DC | PRN
  Filled 2017-11-04: qty 5

## 2017-11-04 MED ORDER — CISATRACURIUM BOLUS VIA INFUSION
0.1000 mg/kg | Freq: Once | INTRAVENOUS | Status: DC
  Filled 2017-11-04: qty 10

## 2017-11-04 MED ORDER — SODIUM BICARBONATE 8.4 % IV SOLN
INTRAVENOUS | Status: AC | PRN
  Administered 2017-11-04: 100 meq via INTRAVENOUS

## 2017-11-04 MED ORDER — SODIUM CHLORIDE 0.9 % IV SOLN
2.0000 mg/h | INTRAVENOUS | Status: DC
  Filled 2017-11-04: qty 10

## 2017-11-04 MED ORDER — FENTANYL 2500MCG IN NS 250ML (10MCG/ML) PREMIX INFUSION
100.0000 ug/h | INTRAVENOUS | Status: DC
  Administered 2017-11-04: 100 ug/h via INTRAVENOUS
  Administered 2017-11-05: 200 ug/h via INTRAVENOUS
  Administered 2017-11-05: 250 ug/h via INTRAVENOUS
  Administered 2017-11-06 (×2): 300 ug/h via INTRAVENOUS
  Administered 2017-11-06 – 2017-11-08 (×2): 100 ug/h via INTRAVENOUS
  Filled 2017-11-04 (×7): qty 250

## 2017-11-04 MED ORDER — ALTEPLASE 2 MG IJ SOLR
2.0000 mg | Freq: Once | INTRAMUSCULAR | Status: AC | PRN
  Administered 2017-11-07: 2 mg
  Filled 2017-11-04 (×2): qty 2

## 2017-11-04 MED ORDER — ASPIRIN 300 MG RE SUPP
300.0000 mg | RECTAL | Status: AC
  Administered 2017-11-04: 300 mg via RECTAL
  Filled 2017-11-04: qty 1

## 2017-11-04 MED ORDER — INSULIN ASPART 100 UNIT/ML ~~LOC~~ SOLN
0.0000 [IU] | SUBCUTANEOUS | Status: DC
  Administered 2017-11-04 – 2017-11-08 (×9): 1 [IU] via SUBCUTANEOUS
  Administered 2017-11-08: 2 [IU] via SUBCUTANEOUS
  Administered 2017-11-09: 1 [IU] via SUBCUTANEOUS
  Administered 2017-11-09: 2 [IU] via SUBCUTANEOUS
  Administered 2017-11-09: 3 [IU] via SUBCUTANEOUS

## 2017-11-04 MED ORDER — CHLORHEXIDINE GLUCONATE CLOTH 2 % EX PADS
6.0000 | MEDICATED_PAD | Freq: Every day | CUTANEOUS | Status: DC
  Administered 2017-11-05 – 2017-11-09 (×4): 6 via TOPICAL

## 2017-11-04 MED ORDER — MIDAZOLAM BOLUS VIA INFUSION
1.0000 mg | INTRAVENOUS | Status: DC | PRN
  Filled 2017-11-04: qty 1

## 2017-11-04 MED ORDER — FENTANYL CITRATE (PF) 100 MCG/2ML IJ SOLN: 50.0000 ug | Freq: Once | INTRAMUSCULAR | Status: DC

## 2017-11-04 MED ORDER — SODIUM CHLORIDE 0.9 % FOR CRRT
INTRAVENOUS_CENTRAL | Status: DC | PRN
  Administered 2017-11-04: 17:00:00 via INTRAVENOUS_CENTRAL
  Filled 2017-11-04: qty 1000

## 2017-11-04 MED ORDER — NOREPINEPHRINE 4 MG/250ML-% IV SOLN: 0.0000 ug/min | INTRAVENOUS | Status: DC

## 2017-11-04 MED ORDER — CHLORHEXIDINE GLUCONATE CLOTH 2 % EX PADS
6.0000 | MEDICATED_PAD | Freq: Every day | CUTANEOUS | Status: DC
  Administered 2017-11-04 – 2017-11-09 (×6): 6 via TOPICAL

## 2017-11-04 MED ORDER — SODIUM CHLORIDE 0.9% FLUSH: 10.0000 mL | INTRAVENOUS | Status: DC | PRN

## 2017-11-04 MED ORDER — ROCURONIUM BROMIDE 50 MG/5ML IV SOLN
INTRAVENOUS | Status: AC | PRN
  Administered 2017-11-04: 100 mg via INTRAVENOUS

## 2017-11-04 MED ORDER — METHYLPREDNISOLONE SODIUM SUCC 40 MG IJ SOLR: 20.0000 mg | INTRAMUSCULAR | Status: DC

## 2017-11-04 MED ORDER — DEXTROSE 50 % IV SOLN
INTRAVENOUS | Status: AC | PRN
  Administered 2017-11-04: 1 via INTRAVENOUS

## 2017-11-04 MED ORDER — SODIUM CHLORIDE 0.9 % IV SOLN
1.0000 ug/kg/min | INTRAVENOUS | Status: DC
  Filled 2017-11-04: qty 20

## 2017-11-04 MED ORDER — CHLORHEXIDINE GLUCONATE 0.12% ORAL RINSE (MEDLINE KIT)
15.0000 mL | Freq: Two times a day (BID) | OROMUCOSAL | Status: DC
  Administered 2017-11-04 – 2017-11-09 (×10): 15 mL via OROMUCOSAL

## 2017-11-04 MED ORDER — PRISMASOL BGK 0/2.5 32-2.5 MEQ/L IV SOLN
INTRAVENOUS | Status: DC
  Administered 2017-11-04 – 2017-11-05 (×2): via INTRAVENOUS_CENTRAL
  Filled 2017-11-04 (×4): qty 5000

## 2017-11-04 MED ORDER — SODIUM CHLORIDE 0.9% FLUSH
10.0000 mL | Freq: Two times a day (BID) | INTRAVENOUS | Status: DC
  Administered 2017-11-04 – 2017-11-06 (×5): 10 mL

## 2017-11-04 MED ORDER — ORAL CARE MOUTH RINSE
15.0000 mL | OROMUCOSAL | Status: DC
  Administered 2017-11-04 – 2017-11-09 (×47): 15 mL via OROMUCOSAL

## 2017-11-04 MED ORDER — MUPIROCIN 2 % EX OINT
1.0000 "application " | TOPICAL_OINTMENT | Freq: Two times a day (BID) | CUTANEOUS | Status: AC
  Administered 2017-11-04 – 2017-11-09 (×10): 1 via NASAL
  Filled 2017-11-04 (×2): qty 22

## 2017-11-04 MED ORDER — FAMOTIDINE IN NACL 20-0.9 MG/50ML-% IV SOLN: 20.0000 mg | Freq: Two times a day (BID) | INTRAVENOUS | Status: DC

## 2017-11-04 NOTE — ED Triage Notes (Signed)
Pt was on the way to dialysis on the scat van and slumped over, went unresponsive. Scat driver notified RN's at dialysis clinic but they would not come out and help until EMS arrived, unknown downtime. After ems arrival it took 10 minutes to get the pt off of the van and onto a monitor. Pt was noted to be pulsesless apneic and asystole on the monitor. CPR started, pt received 2 shocks due to v-fib, 9 epis, calcium, and bicarb in route. Pt apneic and pulses upon arrival here. Has an IO in the right tibia and a king airway inplace. Samuel Bouche is running with compressions. According to son pt missed dialysis treatment 2 days ago. Last full dialysis treatment was tuesday 4 days ago.

## 2017-11-04 NOTE — Code Documentation (Signed)
Lab called with critical trop of 0.08, Dr Erma Heritage notified.

## 2017-11-04 NOTE — H&P (Addendum)
NAME:  Guy Fry, MRN:  [id number], DOB:  Jun 17, 1950, LOS: 0 ADMISSION DATE:  11/12/2017, CONSULTATION DATE:  10/22/2017 REFERRING MD:  EDP, CHIEF COMPLAINT:  Cardiac arrest  Brief History   66 year old with end-stage renal disease.  Admitted for asystolic cardiac arrest after missed dialysis Total downtime approximately atleast 20 mins.  Noted to be hyperkalemic after resuscitation.  Recently moved here from [place].  Not much history in the chart.  Past Medical History  End-stage renal disease, atrial fibrillation, diabetes type 2, CVA with right hemiparesis, chronic subdural hematoma, gout, GERD, hyperlipidemia. Significant Hospital Events   10/19- Admit with PEA arrest  Consults: date of consult/date signed off & final recs:  Nephrology 10/23/2017-   Procedures (surgical and bedside):  ETT 10/19 >   Significant Diagnostic Tests:  CT head 10/19 > pending  Micro Data:  Bcx 10/19 Spcx 10/19  Antimicrobials:    Subjective:   Objective   Pulse 82, resp. rate 18, SpO2 95 %.    Vent Mode: PRVC FiO2 (%):  [100 %] 100 % Set Rate:  [18 bmp] 18 bmp Vt Set:  [530 mL] 530 mL PEEP:  [5 cmH20] 5 cmH20 Plateau Pressure:  [16 cmH20] 16 cmH20  No intake or output data in the 24 hours ending 11/02/2017 1336 There were no vitals filed for this visit.  Examination: Gen:      No acute distress HEENT:  EOMI, sclera anicteric, ET tube Neck:     No masses; no thyromegaly Lungs:    Clear to auscultation bilaterally; normal respiratory effort CV:         Regular rate and rhythm; no murmurs Abd:      + bowel sounds; soft, non-tender; no palpable masses, no distension Ext:    1-2+ edema; adequate peripheral perfusion Skin:      Warm and dry; no rash Neuro: Sedated, unresponsive.  Pupils reactive, no gag reflex.  Resolved Hospital Problem list     Assessment & Plan:  66 year old with end-stage renal disease, cardiac arrest, hyperkalemia after missed dialysis  Cardiac  arrest Start hypothermia protocol if CT head is okay. Central line, Levophed for pressor support  Atrial fibrillation Hold flecainde Telemetry monitoring Check echocardiogram, troponin, EKG  End-stage renal disease Consult nephrology.  May need CVVH given hemodynamic instability.  Diabetes Hold outpatient medication SSI coverage.  Prior CVA, H/O SDH in [place] in July 19 CT head  Disposition / Summary of Today's Plan 10/31/2017   Admit to ICU, start hypothermia protocol after CT head. Levophed for shock after placement of central line    Diet: NPO Pain/Anxiety/Delirium protocol (if indicated): sedation per hypothermia protocol DVT prophylaxis: Lovenox GI prophylaxis: Pepcid Hyperglycemia protocol: SSI coverage Mobility: Bed rest Code Status: Full Family Communication: Son updated at bedside  Labs   CBC: Recent Labs  Lab 10/29/17 1110 10/29/17 1121 10/30/17 0425 11/05/2017 1312  WBC 9.6  --  9.8  --   NEUTROABS 6.5  --   --   --   HGB 8.6* 9.2* 8.9* 8.8*  HCT 28.9* 27.0* 29.8* 26.0*  MCV 83.8  --  83.5  --   PLT 219  --  209  --     Basic Metabolic Panel: Recent Labs  Lab 10/29/17 1110 10/29/17 1121 10/30/17 0425 10/29/2017 1312  NA 136 136 138 141  K 5.2* 5.1 5.0 6.2*  CL 100 98 103 105  CO2 26  --  25  --   GLUCOSE 116* 112* 120* 150*  BUN 30* 29* 35* 60*  CREATININE 4.41* 4.90* 5.08* 5.70*  CALCIUM 8.5*  --  8.3*  --    GFR: Estimated Creatinine Clearance: 13.7 mL/min (A) (by C-G formula based on SCr of 5.7 mg/dL (H)). Recent Labs  Lab 10/29/17 1110 10/30/17 0425 11/03/2017 1313  WBC 9.6 9.8  --   LATICACIDVEN  --   --  5.25*    Liver Function Tests: Recent Labs  Lab 10/29/17 1110 10/30/17 0425  AST 19 19  ALT 10 9  ALKPHOS 78 79  BILITOT 0.6 0.4  PROT 5.2* 5.5*  ALBUMIN 2.1* 2.1*   No results for input(s): LIPASE, AMYLASE in the last 168 hours. No results for input(s): AMMONIA in the last 168 hours.  ABG    Component Value  Date/Time   HCO3 30.6 (H) 10/29/2017 1128   TCO2 28 10/29/2017 1312   O2SAT 100.0 10/29/2017 1128     Coagulation Profile: Recent Labs  Lab 10/29/17 1110  INR 1.16    Cardiac Enzymes: No results for input(s): CKTOTAL, CKMB, CKMBINDEX, TROPONINI in the last 168 hours.  HbA1C: No results found for: HGBA1C  CBG: Recent Labs  Lab 10/29/17 1125 10/29/17 2125 10/30/17 0842 10/30/17 1155  GLUCAP 107* 137* 117* 109*    Admitting History of Present Illness.   67 year old with complicated medical history including end-stage renal disease, subdural hematoma, CVA.  Missed hemodialysis on 10/17 since he had a UTI.  Put on Levaquin as an outpatient. Slumped over in SC[employer] bus while going to dialysis.  Noted to be asystolic arrest.  Given 9 rounds of epi went into V. Fib. Brought to the ED with a [facility] airway.  Intubated without difficulty.  Total downtime at least  20 minutes PCCM called for consultation. Review of Systems:   Unable to obtain as has altered mental status.   Past Medical History  He,  has a past medical history of Adjustment disorder, Atrial fibrillation (HCC), Chronic subdural hematoma (HCC), CVA (cerebral vascular accident) (HCC), GERD (gastroesophageal reflux disease), Gout, HLD (hyperlipidemia), HTN (hypertension), Renal disorder, and Type 2 DM with hypertension and ESRD on dialysis (HCC).   Surgical History   No past surgical history on file.   Social History   Social History   Socioeconomic History  . Marital status: Widowed    Spouse name: Not on file  . Number of children: Not on file  . Years of education: Not on file  . Highest education level: Not on file  Occupational History  . Occupation: Real estate  Social Needs  . Financial resource strain: Not on file  . Food insecurity:    Worry: Not on file    Inability: Not on file  . Transportation needs:    Medical: Not on file    Non-medical: Not on file  Tobacco Use  . Smoking status: Former  Games developer  . Smokeless tobacco: Never Used  Substance and Sexual Activity  . Alcohol use: Not Currently    Comment: Previous EtOH use  . Drug use: Never  . Sexual activity: Not on file  Lifestyle  . Physical activity:    Days per week: Not on file    Minutes per session: Not on file  . Stress: Not on file  Relationships  . Social connections:    Talks on phone: Not on file    Gets together: Not on file    Attends religious service: Not on file    Active member of club or organization: Not  on file    Attends meetings of clubs or organizations: Not on file    Relationship status: Not on file  . Intimate partner violence:    Fear of current or ex partner: Not on file    Emotionally abused: Not on file    Physically abused: Not on file    Forced sexual activity: Not on file  Other Topics Concern  . Not on file  Social History Narrative  . Not on file  ,  reports that he has quit smoking. He has never used smokeless tobacco. He reports that he drank alcohol. He reports that he does not use drugs.   Family History   His family history includes CAD in his father and mother.   Allergies Allergies  Allergen Reactions  . Lisinopril      Home Medications  Prior to Admission medications   Medication Sig Start Date End Date Taking? Authorizing Provider  acetaminophen (TYLENOL) 650 MG CR tablet Take 650 mg by mouth every 4 (four) hours as needed for pain.    [provider]  allopurinol (ZYLOPRIM) 100 MG tablet Take 100 mg by mouth daily.    [provider]  aspirin EC 81 MG tablet Take 81 mg by mouth daily.    [provider]  atorvastatin (LIPITOR) 40 MG tablet Take 40 mg by mouth daily.    [provider]  calcium acetate (PHOSLO) 667 MG capsule Take 1,334 mg by mouth 2 (two) times daily with a meal.    [provider]  colchicine 0.6 MG tablet Take 0.6 mg by mouth daily.    [provider]  flecainide (TAMBOCOR) 100 MG tablet  Take 100 mg by mouth 2 (two) times daily.    [provider]  gabapentin (NEURONTIN) 100 MG capsule Take 100 mg by mouth 2 (two) times daily.    [provider]  HYDROcodone-acetaminophen (NORCO/VICODIN) 5-325 MG tablet Take 1 tablet by mouth every 6 (six) hours as needed for moderate pain.    [provider]  insulin detemir (LEVEMIR) 100 UNIT/ML injection Inject 10 Units into the skin every 12 (twelve) hours.    [provider]  ketoconazole (NIZORAL) 2 % cream Apply 1 application topically daily.    [provider]  minoxidil (LONITEN) 2.5 MG tablet Take 5 mg by mouth 2 (two) times daily.    [provider]  NON FORMULARY Apply 1 application topically 3 (three) times daily. Dermacloud  Cream  Use every shift change on buttocks to prevent breakdown    [provider]  omeprazole (PRILOSEC) 20 MG capsule Take 20 mg by mouth daily.    [provider]  polyethylene glycol (MIRALAX / GLYCOLAX) packet Take 17 g by mouth daily.    [provider]  sennosides-docusate sodium (SENOKOT-S) 8.6-50 MG tablet Take 2 tablets by mouth 2 (two) times daily.    [provider]  Skin Protectants, Misc. (EUCERIN) cream Apply 1 application topically daily. Apply to dry flaky skin    [provider]     Critical care time: 45 mins    The patient is critically ill with multiple organ system failure and requires high complexity decision making for assessment and support, frequent evaluation and titration of therapies, advanced monitoring, review of radiographic studies and interpretation of complex data.   Critical Care Time devoted to patient care services, exclusive of separately billable procedures, described in this note is 45 minutes.   Chilton Greathouse MD [place]  [facility] Pager [phone] If no answer or after 3pm call: [phone] 2017/12/04, 2:03 PM

## 2017-11-04 NOTE — ED Notes (Signed)
BP 153/84, levo stopped

## 2017-11-04 NOTE — Code Documentation (Signed)
Critical Care and Nephrology at bedside.

## 2017-11-04 NOTE — Code Documentation (Signed)
King airway removed, pt to be intubated by Dr. Erma Heritage with RT, 2 RNs and EMT at bedside.

## 2017-11-04 NOTE — ED Provider Notes (Signed)
[place] EMERGENCY DEPARTMENT Provider Note   CSN: [id number] Arrival date & time: 10/19/2017  1253     History   Chief Complaint Chief Complaint  Patient presents with  . Cardiac Arrest    HPI Guy Fry is a 67 y.o. male.  HPI 67 year old male with past medical history as below including end-stage renal disease brought in by EMS with cardiac arrest.  Per report, the patient was on his way to dialysis today.  He reportedly began complaining of abdominal pain and not feeling well prior to arriving.  He then became unresponsive in the parking lot.  The nurses of the dialysis center told the bus driver that they could not take him in this condition.  911 was activated and EMS arrived and found patient in asystole, unresponsive.  Compressions were begun.  Patient underwent multiple rounds of CPR and ultimately received 9 doses of epinephrine as well as 2 shocks for what is described as one episode of V. fib as well as ventricular tachycardia.  Since then, the patient has had a weak, thready pulse.  He arrives with CPR in progress.  He was given 1 dose of calcium and bicarbonate.  Level 5 caveat invoked as remainder of history, ROS, and physical exam limited due to patient's unresponsiveness.   Past Medical History:  Diagnosis Date  . Adjustment disorder   . Atrial fibrillation ([place])   . 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 CVA (cerebral vascular accident) ([place])   . GERD (gastroesophageal reflux disease)   . Gout   . HLD (hyperlipidemia)   . HTN (hypertension)   . Renal disorder   . 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 Cardiac arrest ([place]) 11/13/2017  . ESRD (end stage renal disease) ([place]) 10/30/2017  . Paroxysmal A-fib ([place]) 10/30/2017  . Hemiparesis affecting right side as late effect of cerebrovascular accident ([place]) 10/30/2017  . Chronic subdural hematoma ([place]) 10/30/2017    . Hypotension 10/29/2017    History reviewed. No pertinent surgical history.      Home Medications    Prior to Admission medications   Medication Sig Start Date End Date Taking? Authorizing Provider  allopurinol (ZYLOPRIM) 100 MG tablet Take 100 mg by mouth daily.   Yes [provider]  aspirin EC 81 MG tablet Take 81 mg by mouth daily.   Yes [provider]  atorvastatin (LIPITOR) 40 MG tablet Take 40 mg by mouth daily.   Yes [provider]  calcium acetate (PHOSLO) 667 MG capsule Take 1,334 mg by mouth 2 (two) times daily with a meal.   Yes [provider]  cefTRIAXone 1 g in dextrose 5 % 50 mL Inject 1 g into the vein once.   Yes [provider]  colchicine 0.6 MG tablet Take 0.6 mg by mouth daily.   Yes [provider]  flecainide (TAMBOCOR) 100 MG tablet Take 100 mg by mouth 2 (two) times daily.   Yes [provider]  gabapentin (NEURONTIN) 100 MG capsule Take 100 mg by mouth 2 (two) times daily.   Yes [provider]  insulin detemir (LEVEMIR) 100 UNIT/ML injection Inject 10 Units into the skin every 12 (twelve) hours.   Yes [provider]  ketoconazole (NIZORAL) 2 % cream Apply 1 application topically daily.   Yes [provider]  levofloxacin (LEVAQUIN) 750 MG tablet Take 750  mg by mouth once.   Yes [provider]  minoxidil (LONITEN) 2.5 MG tablet Take 5 mg by mouth 2 (two) times daily.   Yes [provider]  NON FORMULARY Apply 1 application topically 3 (three) times daily. [place]acl[place]  Cream  Use every shift change on buttocks to prevent breakdown   Yes [provider]  omeprazole (PRILOSEC) 20 MG capsule Take 20 mg by mouth daily.   Yes [provider]  polyethylene glycol (MIRALAX / GLYCOLAX) packet Take 17 g by mouth daily.   Yes [provider]  Probiotic Product (PROBIOTIC DAILY PO) Take 2 tablets by mouth daily.   Yes [provider]  sennosides-docusate sodium (SENOKOT-S) 8.6-50 MG tablet Take 2 tablets by mouth 2 (two) times daily as needed for constipation.    Yes [provider]  Skin Protectants, Misc. (EUCERIN) cream Apply 1 application topically at bedtime. Apply to dry flaky skin    Yes [provider]  carvedilol (COREG) 25 MG tablet Take 25 mg by mouth 2 (two) times daily with a meal.    [provider]  ciprofloxacin (CIPRO) 500 MG tablet Take 500 mg by mouth 2 (two) times daily.    [provider]    Family History Family History  Problem Relation Age of Onset  . CAD Mother   . CAD Father     Social History Social History   Tobacco Use  . Smoking status: Former Research scientist (life sciences)  . Smokeless tobacco: Never Used  Substance Use Topics  . Alcohol use: Not Currently    Comment: Previous EtOH use  . Drug use: Never     Allergies   Lisinopril   Review of Systems Review of Systems  Unable to perform ROS: Patient unresponsive     Physical Exam Updated Vital Signs BP (!) 154/84   Pulse 85   Temp 98.8 F (37.1 C)   Resp (!) 9   Ht _0  (1.702 m)   Wt 85.2 kg   SpO2 100%   BMI 29.42 kg/m   Physical Exam  Constitutional:  Unresponsive, King airway in place.  Active compressions being performed.  HENT:  Pupils 4 mm, reactive bilaterally.  Does not blink to threat.  Mild tongue trauma noted from airway placement.  Eyes:  As above.  Pupils reactive.  Neck:  No JVD noted  Cardiovascular:  Regular, faint.  Distant heart sounds.  Pulmonary/Chest:  Intermittent spontaneous respirations noted with King airway in place.  Rales bilaterally.  Abdominal:  Soft, no apparent distention.  Musculoskeletal:  No obvious deformity  Neurological:  GCS 3 T.  Occasional breaths noted.  Following resuscitation, patient was noted to be biting on ET tube but with no purposeful movement or localization.  Skin:  Cool     ED Treatments / Results  Labs (all  labs ordered are listed, but only abnormal results are displayed) Labs Reviewed  BASIC METABOLIC PANEL - Abnormal; Notable for the following components:      Result Value   Potassium 6.0 (*)    Glucose, Bld 153 (*)    BUN 57 (*)    Creatinine, Ser 5.58 (*)    Calcium 11.1 (*)    GFR calc non Af Amer 10 (*)    GFR calc Af Amer 11 (*)    All other components within normal limits  CBC - Abnormal; Notable for the following components:   WBC 25.2 (*)    RBC 3.41 (*)  Hemoglobin 8.7 (*)    HCT 28.4 (*)    MCH 25.5 (*)    RDW 16.3 (*)    All other components within normal limits  PROTIME-INR - Abnormal; Notable for the following components:   Prothrombin Time 15.5 (*)    All other components within normal limits  TROPONIN I - Abnormal; Notable for the following components:   Troponin I 0.08 (*)    All other components within normal limits  HEPATIC FUNCTION PANEL - Abnormal; Notable for the following components:   Total Protein 5.4 (*)    Albumin 1.4 (*)    AST 42 (*)    Indirect Bilirubin 0.2 (*)    All other components within normal limits  URINALYSIS, ROUTINE W REFLEX MICROSCOPIC - Abnormal; Notable for the following components:   APPearance CLOUDY (*)    Hgb urine dipstick LARGE (*)    Protein, ur 100 (*)    Leukocytes, UA SMALL (*)    RBC / HPF >50 (*)    All other components within normal limits  APTT - Abnormal; Notable for the following components:   aPTT 42 (*)    All other components within normal limits  I-STAT CHEM 8, ED - Abnormal; Notable for the following components:   Potassium 6.2 (*)    BUN 60 (*)    Creatinine, Ser 5.70 (*)    Glucose, Bld 150 (*)    Calcium, Ion 1.47 (*)    Hemoglobin 8.8 (*)    HCT 26.0 (*)    All other components within normal limits  I-STAT CG4 LACTIC ACID, ED - Abnormal; Notable for the following components:   Lactic Acid, Venous 5.25 (*)    All other components within normal limits  CBG MONITORING, ED - Abnormal; Notable for  the following components:   Glucose-Capillary 104 (*)    All other components within normal limits  I-STAT ARTERIAL BLOOD GAS, ED - Abnormal; Notable for the following components:   pCO2 arterial 52.9 (*)    pO2, Arterial 128.0 (*)    Bicarbonate 29.4 (*)    Acid-Base Excess 3.0 (*)    All other components within normal limits  POCT I-STAT, CHEM 8 - Abnormal; Notable for the following components:   Potassium 5.5 (*)    BUN 57 (*)    Creatinine, Ser 6.40 (*)    Glucose, Bld 130 (*)    Hemoglobin 10.2 (*)    HCT 30.0 (*)    All other components within normal limits  CULTURE, BLOOD (ROUTINE X 2)  CULTURE, BLOOD (ROUTINE X 2)  CULTURE, RESPIRATORY  MRSA PCR SCREENING  MAGNESIUM  PROTIME-INR  TROPONIN I  TROPONIN I  TROPONIN I  PROTIME-INR  APTT  BLOOD GAS, ARTERIAL  RENAL FUNCTION PANEL  I-STAT TROPONIN, ED    EKG None  Radiology Ct Head Wo Contrast  Addendum Date: 11/14/2017   ADDENDUM REPORT: 11/13/2017 16:04 ADDENDUM: These results were called by telephone at the time of interpretation on 10/30/2017 at 4:04 pm to Dr. Vaughan Browner, who verbally acknowledged these results. Electronically Signed   By: San Morelle M.D.   On: 10/20/2017 16:04   Result Date: 10/30/2017 CLINICAL DATA:  Altered level of consciousness following CPR for cardiac arrest. EXAM: CT HEAD WITHOUT CONTRAST TECHNIQUE: Contiguous axial images were obtained from the base of the skull through the vertex without intravenous contrast. COMPARISON:  CT head 10/29/2017 FINDINGS: Brain: A right extra-axial intermediate density collection is increasing in size, now measuring 14.5 mm on  coronal images compared with 11 mm previously. There is mass effect with effacement of the adjacent sulci. No significant midline shift is present. There is partial effacement the right lateral ventricle. No acute infarct or parenchymal hemorrhage is present. Basal ganglia are intact. Remote lacunar infarct of the left pons is noted.  The brainstem and cerebellum are otherwise unremarkable. Vascular: Atherosclerotic calcifications are present within the cavernous internal carotid arteries bilaterally and at the dural margin of the vertebral arteries bilaterally. There is no hyperdense vessel. Skull: Teeth calvarium is intact. No focal lytic or blastic lesions are present. Sinuses/Orbits: The paranasal sinuses and mastoid air cells are clear. Globes and orbits are within normal limits. IMPRESSION: 1. Increasing size of intermediate density extra-axial collection over the right parietal lobe with local mass effect. This raises concern for acute on chronic subdural or epidural hematoma. No hyperdense blood products are present. 2. 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 No acute infarct. Electronically Signed: By: San Morelle M.D. On: 10/19/2017 16:00   Dg Chest Portable 1 View  Result Date: 10/30/2017 CLINICAL DATA:  Line placement. EXAM: PORTABLE CHEST 1 VIEW COMPARISON:  Radiograph of same day. FINDINGS: Stable cardiomegaly. Endotracheal and nasogastric tubes are unchanged in position. Right internal jugular dialysis catheter is unchanged in position. Interval placement of left internal jugular catheter with tip in expected position of SVC. No pneumothorax or pleural effusion is noted. Lungs are clear. Bony thorax is unremarkable. IMPRESSION: Interval placement of left internal jugular catheter with tip in expected position of SVC. No pneumothorax is noted. Stable support apparatus. Electronically Signed   By: Marijo Conception, M.D.   On: 10/24/2017 15:49   Dg Chest Portable 1 View  Result Date: 11/09/2017 CLINICAL DATA:  Post CPR.  ET tube placement. EXAM: PORTABLE CHEST 1 VIEW COMPARISON:  10/29/2017 FINDINGS: Endotracheal tube has tip 3.9 cm above the carina. Nasogastric tube has tip and side-port over the stomach in the left upper quadrant. Right IJ dialysis catheter has tip over the cavoatrial junction. Lungs are  adequately inflated as patient is slightly rotated to the right. There is subtle opacification over the mid to upper right lung likely atelectasis. Cardiomediastinal silhouette and remainder of the exam is unchanged. IMPRESSION: Subtle opacification over the right mid to upper lung likely atelectasis. Tubes and lines as described. Electronically Signed   By: Marin Olp M.D.   On: 10/17/2017 13:45    Procedures .Critical Care Performed by: Duffy Bruce, MD Authorized by: Duffy Bruce, MD   Critical care provider statement:    Critical care time (minutes):  75   Critical care time was exclusive of:  Separately billable procedures and treating other patients and teaching time   Critical care was necessary to treat or prevent imminent or life-threatening deterioration of the following conditions:  Cardiac failure, circulatory failure and metabolic crisis   Critical care was time spent personally by me on the following activities:  Development of treatment plan with patient or surrogate, discussions with consultants, evaluation of patient's response to treatment, examination of patient, obtaining history from patient or surrogate, ordering and performing treatments and interventions, ordering and review of laboratory studies, ordering and review of radiographic studies, pulse oximetry, re-evaluation of patient's condition and review of old charts   I assumed direction of critical care for this patient from another provider in my specialty: no   .Central Line Date/Time: 11/03/2017 4:27 PM Performed by: Duffy Bruce, MD Authorized by: Duffy Bruce, MD   Consent:  Consent obtained:  Emergent situation Pre-procedure details:    Hand hygiene: Hand hygiene performed prior to insertion     Sterile barrier technique: All elements of maximal sterile technique followed     Skin preparation:  ChloraPrep   Skin preparation agent: Skin preparation agent completely dried prior to procedure     Procedure details:    Location:  L internal jugular   Site selection rationale:  R IJ dialysis port in place, per discussion with CCM   Patient position:  Trendelenburg (Head of bed tilted downward)   Procedural supplies:  Triple lumen   Catheter size:  7 Fr   Landmarks identified: yes     Ultrasound guidance: yes     Sterile ultrasound techniques: Sterile gel and sterile probe covers were used     Number of attempts:  1   Successful placement: yes   Post-procedure details:    Post-procedure:  Dressing applied and line sutured   Assessment:  Blood return through all ports, free fluid flow, no pneumothorax on x-ray and placement verified by x-ray   Patient tolerance of procedure:  Tolerated well, no immediate complications Procedure Name: Intubation Date/Time: 11/16/2017 4:45 PM Performed by: Duffy Bruce, MD Pre-anesthesia Checklist: Patient identified, Patient being monitored, Emergency Drugs available, Timeout performed and Suction available Oxygen Delivery Method: Non-rebreather mask Preoxygenation: Pre-oxygenation with 100% oxygen Induction Type: Rapid sequence Ventilation: Mask ventilation without difficulty Laryngoscope Size: Glidescope and 4 Grade View: Grade I Tube size: 7.5 mm Number of attempts: 1 Airway Equipment and Method: Video-laryngoscopy Placement Confirmation: ETT inserted through vocal cords under direct vision,  CO2 detector and Breath sounds checked- equal and bilateral Secured at: 24 cm Tube secured with: ETT holder Dental Injury: Teeth and Oropharynx as per pre-operative assessment  Difficulty Due To: Difficulty was unanticipated Future Recommendations: Recommend- induction with short-acting agent, and alternative techniques readily available      (including critical care time)  Cardiopulmonary Resuscitation (CPR) Procedure Note Directed/Performed by: Evonnie Pat I personally directed ancillary staff and/or performed CPR in an effort to regain  return of spontaneous circulation and to maintain cardiac, neuro and systemic perfusion.    IV Placement: Left EJ placed on arrival by myself 2/2 difficulty obtaining access from nursing. 18g IV inserted under sterile conditions. Tolerated well with good, non pulsatile, dark red blood return.  Medications Ordered in ED Medications  0.9 %  sodium chloride infusion (250 mLs Intravenous New Bag/Given 10/29/2017 1339)  norepinephrine (LEVOPHED) '4mg'$  in D5W 246m premix infusion (has no administration in time range)  artificial tears (LACRILUBE) ophthalmic ointment 1 application (has no administration in time range)  0.9 %  sodium chloride infusion (has no administration in time range)  fentaNYL (SUBLIMAZE) injection 50 mcg (has no administration in time range)  fentaNYL 25047m in NS 25080m74m11ml) infusion-PREMIX (100 mcg/hr Intravenous New Bag/Given 11/16/2017 1526)  fentaNYL (SUBLIMAZE) bolus via infusion 25 mcg (has no administration in time range)  insulin aspart (novoLOG) injection 0-9 Units (has no administration in time range)  famotidine (PEPCID) IVPB 20 mg premix (has no administration in time range)  sodium chloride 0.9 % primer fluid for CRRT (has no administration in time range)  alteplase (CATHFLO ACTIVASE) injection 2 mg (has no administration in time range)  heparin injection 1,000-6,000 Units (has no administration in time range)  prismasol BGK 0/2.5 5,000 mL dialysis replacement fluid (has no administration in time range)  prismasol BGK 0/2.5 5,000 mL dialysis replacement fluid (has no administration in time range)  prismasol  BGK 0/2.5 5,000 mL dialysis solution (has no administration in time range)  sodium chloride flush (NS) 0.9 % injection 10-40 mL (has no administration in time range)  sodium chloride flush (NS) 0.9 % injection 10-40 mL (has no administration in time range)  Chlorhexidine Gluconate Cloth 2 % PADS 6 each (has no administration in time range)  chlorhexidine  gluconate (MEDLINE KIT) (PERIDEX) 0.12 % solution 15 mL (has no administration in time range)  MEDLINE mouth rinse (has no administration in time range)  albuterol (PROVENTIL,VENTOLIN) solution continuous neb (10 mg/hr Nebulization Given 10/28/2017 1420)  aspirin suppository 300 mg (300 mg Rectal Given 10/28/2017 1438)  sodium bicarbonate injection (100 mEq Intravenous Given 11/01/2017 1301)  calcium chloride injection (1 g Intravenous Given 10/27/2017 1259)  EPINEPHrine (ADRENALIN) 1 MG/10ML injection (1 mg Intravenous Given 11/09/2017 1300)  dextrose 50 % solution (1 ampule Intravenous Given 11/05/2017 1306)  insulin aspart (novoLOG) injection 10 Units (10 Units Intravenous Given 11/13/2017 1306)  calcium chloride injection (1 g Intravenous Given 11/08/2017 1308)  rocuronium (ZEMURON) injection (100 mg Intravenous Given 11/03/2017 1312)  sodium bicarbonate injection (100 mEq Intravenous Given 11/16/2017 1312)  EPINEPHrine (ADRENALIN) 1 MG/10ML injection (0.5 mg Intravenous Given 11/14/2017 1335)     Initial Impression / Assessment and Plan / ED Course  I have reviewed the triage vital signs and the nursing notes.  Pertinent labs & imaging results that were available during my care of the patient were reviewed by me and considered in my medical decision making (see chart for details).     67 year old male here with cardiac arrest.  Patient receiving active compressions on arrival.  Compressions continued for 1 round, then patient noted to have pulse.  Telemetry shows wide-complex, fairly regular rhythm.  Given his clinical history, concern for hyperkalemia leading to primary asystole arrest.  Patient also had V. tach per report.  Patient given multiple doses of bicarb and calcium with narrowing of his QRS and return to what appears to be normal sinus rhythm.  Patient also given insulin and D50.  Initial i-STAT labs do show significant hyperkalemia, and this is after multiple doses of bicarb, so I suspect it was  actually higher prior to arrest.  Intensivist paged and at bedside for admission.  I have also discussed with nephrology for dialysis.  Albuterol given.  Patient intubated by myself to replace [facility] airway for airway security.  Per discussion with critical care, left central line also placed by myself prior to cooling.  Levo fed running via left IJ. Plain films show appropriate CVC and ETT placement.   Final Clinical Impressions(s) / ED Diagnoses   Final diagnoses:  Cardiac arrest ([place])  Hyperkalemia  ESRD on dialysis [facility])    ED Discharge Orders    None       Duffy Bruce, MD 10/23/2017 1646

## 2017-11-04 NOTE — Code Documentation (Signed)
Pt is pulseless, CPR restarted with the lucas.

## 2017-11-04 NOTE — Consult Note (Signed)
Neurosurgery Consultation  Reason for Consult: Subdural hematoma Referring Physician: Mannam  CC: PEA arrest  HPI: This is a 67 y.o. man s/p arrest for unclear reasons, possibly hyperkalemia. CT head performed, which reportedly showed a subdural hematoma, NSGY consulted for further recommendations. He recently had a CTH during a workup for acute on chronic R sided weakness. Unclear what the determination was, but sounds like it was recrudescence of his prior stroke symptoms that improved with fluids. PMHx extensive and includes ESRD, Afib; outpatient med list includes ASA81, no other anti-platelets or anticoagulants. Further Hx limited due to patient's depressed mental status   ROS: A 14 point ROS was performed and is negative except as noted in the HPI but limited to patient's depressed mental status  PMHx:  Past Medical History:  Diagnosis Date  . Adjustment disorder   . Atrial fibrillation (HCC)   . 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 CVA (cerebral vascular accident) (H[facility])   . GERD (gastroesophageal reflux disease)   . Gout   . HLD (hyperlipidemia)   . HTN (hypertension)   . Renal disorder   . 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 CAD Mother   . CAD Father    SocHx:  reports that he has quit smoking. He has never used smokeless tobacco. He reports that he drank alcohol. He reports that he does not use drugs.  Exam: Vital signs in last 24 hours: Temp:  [94.5 F (34.7 C)-99.3 F (37.4 C)] 94.5 F (34.7 C) (10/19 1800) Pulse Rate:  [71-101] 71 (10/19 1800) Resp:  [9-46] 16 (10/19 1800) BP: (72-231)/(52-105) 150/82 (10/19 1800) SpO2:  [89 %-100 %] 100 % (10/19 1800) FiO2 (%):  [50 %-100 %] 50 % (10/19 1612) Weight:  [85.2 kg-90.7 kg] 85.2 kg (10/19 1600) General: Lying in ICU bed, appears acutely ill Head: normocephalic and atruamatic HEENT: neck supple Pulmonary: intubated, good chest rise  bilaterally Musculoskeletal: mildly decreased bulk Abdomen: S NT ND Extremities: warm and well perfused x4 Neuro:  Pupils pinpoint, no corneals, +cough/gag No response to painful stimulus x4   Assessment and Plan: 67 y.o. man s/p arrest. CTH personally reviewed, which shows 12mm R posterior subacute versus chronic SDH vs EDH. Likely loculated SDH but shape is more consistent with EDH. Aging of blood products less accurate due to patient's Hb/coagulopathy from renal disease, possible small interval growth in comparison to prior scan. CTH also shows incidental L embolic material in the L MMA distribution, likely left MMA embo for L SDH in the past. -no neurosurgical intervention indicated, collection is only producing a small amount of brain compression -please call with any concerns or questions  Jadene Pierini, MD 10/17/2017 7:09 PM [facility]

## 2017-11-04 NOTE — ED Notes (Signed)
This RN called pt placement concerning bed status, informed them of artic sun, Charge RN on 2H already stated they have a bed open. Pt is going to 2H soon.

## 2017-11-04 NOTE — Code Documentation (Signed)
Dr Erma Heritage at bedside to place central line

## 2017-11-04 NOTE — Code Documentation (Signed)
Pulses Present. HR 110.

## 2017-11-04 NOTE — ED Notes (Signed)
Called pharmacy

## 2017-11-04 NOTE — Code Documentation (Signed)
X-ray at bedside

## 2017-11-04 NOTE — Progress Notes (Signed)
   10/22/2017 1422  Clinical Encounter Type  Visited With Family  Visit Type Initial;Trauma  Referral From Nurse  Consult/Referral To Chaplain  The on call chaplain responded to Code Cool on pager in Trauma B in ED.  Upon arrival and after check in with unit secretary the chaplain met the Pt.'s 2 sons, Marguerite Olea and Vicente Males exiting Trauma B. The chaplain introduced herself after acknowledging the relationship to the Pt. The sons shared presence of extended family in ED waiting area with plans to join them. The chaplain shared her availability as needed for the patient or family.  The chaplain checked in with the RN before leaving the area.

## 2017-11-04 NOTE — Consult Note (Addendum)
Renal Service Consult Note [facility]  Guy Fry 11/01/2017 Requesting Physician: Nelson Chimes  Reason for Consult:  ESRD, dialysis, hyperkalemia  HPI: Guy Fry is a 67 y.o. male with ESRD (unclear cause, on HD since 07/2017), Type 2 DM, HTN, Hx CVA, Hx subdural hematoma who was admitted s/p cardiac arrest.  Pt intubated/ventilated and sedated. Per discussion with med personnel and son, he just moved to [facility] from [place] last week. He had his first HD session in [place] on 10/12. On 10/13, he was seen in the ED with hypotension, admitted overnight, and BP improved with discontinuation of all BP meds. He had his usual HD on 10/15. On 10/16, he was called and told he had a UTI and started on Levaquin. He missed his HD session on 10/17. Today, he was brought to his HD unit this morning via transport van from nursing home. When arrived to the unit, he was slumped over in his wheelchair and unable to stand. Dialysis staff assessed him and called EMS. EMS found him unresponsive, apneic, and pulseless. It was unclear when exactly he lost his pulse. Per RN, it took about 10 minutes to get him out of the [place] before CPR was started.  Unclear full course of CPR events -- sound like rhythm was asystole, then v-fib. Still with active CPR in progress via [facility]che when arrived at [facility]. Was shocked several times and at least 9 rounds of Epi.  He was intubated and given bicarb/ IV insulin and IV Ca chloride x 2 for possible ^K+ in [facility] ED which prompted ROSC. Intake labs with WBC 25, Hgb 8.7, K 6.2, LA 5.25, Trop 0.08. Unable to provide ROS as currently sedated/intubated.  Last dialysis was 10/15 at [facility]. He has a TDC in R chest as his access. His son reports that he was AKI when started HD -- unclear of exact cause, but was in same time frame as his subdural hematoma. Started HD in 07/2017 only.  ROS: Unable to obtain.  Past Medical History  Past Medical History:  Diagnosis Date   . Adjustment disorder   . Atrial fibrillation (HCC)   . 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 CVA (cerebral vascular accident) (HCC)   . GERD (gastroesophageal reflux disease)   . Gout   . HLD (hyperlipidemia)   . HTN (hypertension)   . Renal disorder   . Type 2 DM with hypertension and ESRD on dialysis [facility])    Past Surgical History No past surgical history on file. Family History  Family History  Problem Relation Age of Onset  . CAD Mother   . CAD Father    Social History  reports that he has quit smoking. He has never used smokeless tobacco. He reports that he drank alcohol. He reports that he does not use drugs. Allergies  Allergies  Allergen Reactions  . Lisinopril    Home medications Prior to Admission medications   Medication Sig Start Date End Date Taking? Authorizing Provider  acetaminophen (TYLENOL) 650 MG CR tablet Take 650 mg by mouth every 4 (four) hours as needed for pain.    [provider]  allopurinol (ZYLOPRIM) 100 MG tablet Take 100 mg by mouth daily.    [provider]  aspirin EC 81 MG tablet Take 81 mg by mouth daily.    [provider]  atorvastatin (LIPITOR) 40 MG tablet Take 40 mg by mouth daily.    [provider]  calcium acetate (PHOSLO) [phone]  MG capsule Take 1,334 mg by mouth 2 (two) times daily with a meal.    [provider]  colchicine 0.6 MG tablet Take 0.6 mg by mouth daily.    [provider]  flecainide (TAMBOCOR) 100 MG tablet Take 100 mg by mouth 2 (two) times daily.    [provider]  gabapentin (NEURONTIN) 100 MG capsule Take 100 mg by mouth 2 (two) times daily.    [provider]  HYDROcodone-acetaminophen (NORCO/VICODIN) 5-325 MG tablet Take 1 tablet by mouth every 6 (six) hours as needed for moderate pain.    [provider]  insulin detemir (LEVEMIR) 100 UNIT/ML injection Inject 10 Units into the skin every 12 (twelve) hours.    [provider]   ketoconazole (NIZORAL) 2 % cream Apply 1 application topically daily.    [provider]  minoxidil (LONITEN) 2.5 MG tablet Take 5 mg by mouth 2 (two) times daily.    [provider]  NON FORMULARY Apply 1 application topically 3 (three) times daily. [place]  Cream  Use every shift change on buttocks to prevent breakdown    [provider]  omeprazole (PRILOSEC) 20 MG capsule Take 20 mg by mouth daily.    [provider]  polyethylene glycol (MIRALAX / GLYCOLAX) packet Take 17 g by mouth daily.    [provider]  sennosides-docusate sodium (SENOKOT-S) 8.6-50 MG tablet Take 2 tablets by mouth 2 (two) times daily.    [provider]  Skin Protectants, Misc. (EUCERIN) cream Apply 1 application topically daily. Apply to dry flaky skin    [provider]   Liver Function Tests Recent Labs  Lab 10/29/17 1110 10/30/17 0425  AST 19 19  ALT 10 9  ALKPHOS 78 79  BILITOT 0.6 0.4  PROT 5.2* 5.5*  ALBUMIN 2.1* 2.1*   CBC Recent Labs  Lab 10/29/17 1110 10/29/17 1121 10/30/17 0425 2017/11/05 1312  WBC 9.6  --  9.8  --   NEUTROABS 6.5  --   --   --   HGB 8.6* 9.2* 8.9* 8.8*  HCT 28.9* 27.0* 29.8* 26.0*  MCV 83.8  --  83.5  --   PLT 219  --  209  --    Basic Metabolic Panel Recent Labs  Lab 10/29/17 1110 10/29/17 1121 10/30/17 0425 11-05-2017 1312  NA 136 136 138 141  K 5.2* 5.1 5.0 6.2*  CL 100 98 103 105  CO2 26  --  25  --   GLUCOSE 116* 112* 120* 150*  BUN 30* 29* 35* 60*  CREATININE 4.41* 4.90* 5.08* 5.70*  CALCIUM 8.5*  --  8.3*  --     There were no vitals filed for this visit. Exam Gen: Intubated/ventilated; not responsive/sedated. No rash, cyanosis or gangrene Chest clear bilaterally RRR, no murmur Abd soft, no obvious masses Ext 1+ B LE edema and B UE edema Neuro: Sedated/ventilated   Home meds:  - aspirin 81/ atorvastatin 40  - flecainide 100 bid  - minoxidil 2.5 bid  - gabapentin 100 bid/  hydrocodone w acetaminophen 5- 325 qid prn  - calc acetate ac tid/ allopurinol 100/ colchicine 0.6 qd/ omeprazole 20  - insulin detemir 10 bid   Dialysis:  TTS at [facility], just started there 10/28/17 after moving from [place], [place] 4hr, 400/800, EDW 82.5kg, 2K/2.25Ca, TDC, heparin 2000 bolus - Hectoral IV q HD - Venofer 50mg  IV weekly - Mircera IV q 2 weeks (has not been given  yet)   Impression/ Plan: 1. Cardiac arrest: Prolonged down time, possibly d/t hyperkalemia - will need to treat medically until can dialyze 2. Hyperkalemia: K 6.2 on admit after missing dialysis. Given IV bicarb so far. Treat medically until can be stabilized further with HD (D50/insulin, CaGluc). 3. Hypotension/volume: BP dropping, now on pressors 4. ESRD: Usual TTS schedule, missed last HD. Hypotensive, s/p cardiac arrest - will start CRRT. 5. Anemia of ESRD: Hgb 8.8, start ESA tomorrow. 6. Leukocytosis/?septic: Full septic work-up pending. 7. MBD: Labs pending. No meds for now. 8. T2 DM 9. Hx subdural hematoma 10. Hx CVA  Ozzie Hoyle, PA-C [facility] Pager [phone]  Pt seen, examined and agree w A/P as above.  Vinson Moselle MD [employer] pager [phone]   11/11/2017, 3:26 PM

## 2017-11-04 NOTE — Code Documentation (Signed)
Pulses present  

## 2017-11-04 NOTE — ED Notes (Signed)
This RN called [facility] Charge to inform of artic sun order. Ice packs were placed at 1415 after critical care spoke with family in room.

## 2017-11-04 NOTE — Code Documentation (Signed)
Pt's HR starting to drop, Dr Erma Heritage ordered to give 0.5 amp of epi.

## 2017-11-05 DIAGNOSIS — L8990 Pressure ulcer of unspecified site, unspecified stage: Secondary | ICD-10-CM

## 2017-11-05 LAB — CBC
HEMATOCRIT: 26.9 % — AB (ref 39.0–52.0)
HEMOGLOBIN: 8.4 g/dL — AB (ref 13.0–17.0)
MCH: 25.4 pg — ABNORMAL LOW (ref 26.0–34.0)
MCHC: 31.2 g/dL (ref 30.0–36.0)
MCV: 81.3 fL (ref 80.0–100.0)
Platelets: 198 10*3/uL (ref 150–400)
RBC: 3.31 MIL/uL — ABNORMAL LOW (ref 4.22–5.81)
RDW: 16.3 % — AB (ref 11.5–15.5)
WBC: 12.6 10*3/uL — ABNORMAL HIGH (ref 4.0–10.5)
nRBC: 0 % (ref 0.0–0.2)

## 2017-11-05 LAB — RENAL FUNCTION PANEL
ALBUMIN: 1.7 g/dL — AB (ref 3.5–5.0)
ANION GAP: 8 (ref 5–15)
Albumin: 1.8 g/dL — ABNORMAL LOW (ref 3.5–5.0)
Anion gap: 9 (ref 5–15)
BUN: 36 mg/dL — AB (ref 8–23)
BUN: 24 mg/dL — AB (ref 8–23)
CALCIUM: 8.4 mg/dL — AB (ref 8.9–10.3)
CALCIUM: 8.2 mg/dL — AB (ref 8.9–10.3)
CO2: 28 mmol/L (ref 22–32)
CO2: 27 mmol/L (ref 22–32)
CREATININE: 3.53 mg/dL — AB (ref 0.61–1.24)
CREATININE: 2.6 mg/dL — AB (ref 0.61–1.24)
Chloride: 101 mmol/L (ref 98–111)
Chloride: 102 mmol/L (ref 98–111)
GFR calc Af Amer: 19 mL/min — ABNORMAL LOW (ref >60)
GFR calc non Af Amer: 17 mL/min — ABNORMAL LOW (ref >60)
GFR calc non Af Amer: 24 mL/min — ABNORMAL LOW (ref >60)
GFR, EST AFRICAN AMERICAN: 28 mL/min — AB (ref >60)
GLUCOSE: 101 mg/dL — AB (ref 70–99)
Glucose, Bld: 98 mg/dL (ref 70–99)
POTASSIUM: 3.5 mmol/L (ref 3.5–5.1)
Phosphorus: 4.2 mg/dL (ref 2.5–4.6)
Phosphorus: 4.3 mg/dL (ref 2.5–4.6)
Potassium: 4.2 mmol/L (ref 3.5–5.1)
SODIUM: 138 mmol/L (ref 135–145)
Sodium: 137 mmol/L (ref 135–145)

## 2017-11-05 LAB — URINALYSIS, ROUTINE W REFLEX MICROSCOPIC
Bilirubin Urine: NEGATIVE
Glucose, UA: NEGATIVE mg/dL
Ketones, ur: NEGATIVE mg/dL
NITRITE: NEGATIVE
PROTEIN: 100 mg/dL — AB
RBC / HPF: >50 RBC/hpf — ABNORMAL HIGH (ref 0–5)
SPECIFIC GRAVITY, URINE: 1.017 (ref 1.005–1.030)
WBC, UA: >50 WBC/hpf — ABNORMAL HIGH (ref 0–5)
pH: 5 (ref 5.0–8.0)

## 2017-11-05 LAB — GLUCOSE, CAPILLARY
GLUCOSE-CAPILLARY: 89 mg/dL (ref 70–99)
GLUCOSE-CAPILLARY: 81 mg/dL (ref 70–99)
Glucose-Capillary: 122 mg/dL — ABNORMAL HIGH (ref 70–99)
Glucose-Capillary: 119 mg/dL — ABNORMAL HIGH (ref 70–99)
Glucose-Capillary: 93 mg/dL (ref 70–99)
Glucose-Capillary: 99 mg/dL (ref 70–99)

## 2017-11-05 LAB — BLOOD GAS, ARTERIAL
ACID-BASE EXCESS: 3.9 mmol/L — AB (ref 0.0–2.0)
Bicarbonate: 28.6 mmol/L — ABNORMAL HIGH (ref 20.0–28.0)
DRAWN BY: 44166
FIO2: 40
MECHVT: 530 mL
O2 SAT: 97.4 %
PEEP/CPAP: 5 cmH2O
PH ART: 7.398 (ref 7.350–7.450)
PO2 ART: 103 mmHg (ref 83.0–108.0)
Patient temperature: 97.3
RATE: 18 resp/min
pCO2 arterial: 46.9 mmHg (ref 32.0–48.0)

## 2017-11-05 LAB — MAGNESIUM: Magnesium: 2.1 mg/dL (ref 1.7–2.4)

## 2017-11-05 LAB — TROPONIN I
Troponin I: 0.19 ng/mL (ref <0.03)
Troponin I: 0.13 ng/mL (ref <0.03)

## 2017-11-05 LAB — APTT: aPTT: 48 seconds — ABNORMAL HIGH (ref 24–36)

## 2017-11-05 LAB — PROCALCITONIN: PROCALCITONIN: 20.47 ng/mL

## 2017-11-05 MED ORDER — PRISMASOL BGK 4/2.5 32-4-2.5 MEQ/L IV SOLN
INTRAVENOUS | Status: DC
  Administered 2017-11-05 – 2017-11-07 (×2): via INTRAVENOUS_CENTRAL
  Filled 2017-11-05 (×4): qty 5000

## 2017-11-05 MED ORDER — PRISMASOL BGK 4/2.5 32-4-2.5 MEQ/L IV SOLN
INTRAVENOUS | Status: DC
  Administered 2017-11-05 – 2017-11-07 (×12): via INTRAVENOUS_CENTRAL
  Filled 2017-11-05 (×39): qty 5000

## 2017-11-05 MED ORDER — PRISMASOL BGK 4/2.5 32-4-2.5 MEQ/L IV SOLN
INTRAVENOUS | Status: DC
  Administered 2017-11-05 – 2017-11-07 (×3): via INTRAVENOUS_CENTRAL
  Filled 2017-11-05 (×8): qty 5000

## 2017-11-05 NOTE — Progress Notes (Signed)
NAME:  Guy Fry, MRN:  [id number], DOB:  27-Oct-1950, LOS: 1 ADMISSION DATE:  Nov 21, 2017, CONSULTATION DATE:  11-21-17 REFERRING MD:  EDP, CHIEF COMPLAINT:  Cardiac arrest  Brief History   67 year old with end-stage renal disease.  Admitted for asystolic cardiac arrest after missed dialysis Total downtime approximately atleast 20 mins.  Noted to be hyperkalemic after resuscitation. Recently moved here from [place].  Not much history in the chart.  Past Medical History  End-stage renal disease, atrial fibrillation, diabetes type 2, CVA with right hemiparesis, chronic subdural hematoma, gout, GERD, hyperlipidemia. Significant Hospital Events   10/19- Admit with PEA arrest, started CRRT  Consults: date of consult/date signed off & final recs:  Nephrology Nov 21, 2017-   Procedures (surgical and bedside):  ETT 10/19 >   Significant Diagnostic Tests:  CT head 10/19 > increasing size of subdural hematoma with local mass-effect.  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  Micro Data:  Bcx 10/19 Spcx 10/19 Urine culture 10/20  Antimicrobials:    Subjective:  Continues on CRRT, mental status remains unchanged. Unresponsive on ventilator.  Objective   Blood pressure 108/67, pulse 75, temperature 98.1 F (36.7 C), resp. rate 13, height 5\' 7"  (1.702 m), weight 82.4 kg, SpO2 100 %. CVP:  [2 mmHg-15 mmHg] 6 mmHg  Vent Mode: PRVC FiO2 (%):  [40 %-100 %] 40 % Set Rate:  [18 bmp] 18 bmp Vt Set:  [530 mL-531 mL] 531 mL PEEP:  [5 cmH20] 5 cmH20 Plateau Pressure:  [14 cmH20-17 cmH20] 14 cmH20   Intake/Output Summary (Last 24 hours) at 11/05/2017 1045 Last data filed at 11/05/2017 1000 Gross per 24 hour  Intake 1263.07 ml  Output 1241 ml  Net 22.07 ml   Filed Weights   11/21/2017 1254 11-21-2017 1600 11/05/17 0500  Weight: 90.7 kg 85.2 kg 82.4 kg   Examination: Gen:      No acute distress HEENT:  EOMI, sclera anicteric Neck:     No masses; no thyromegaly Lungs:     Clear to auscultation bilaterally; normal respiratory effort CV:         Regular rate and rhythm; no murmurs Abd:      + bowel sounds; soft, non-tender; no palpable masses, no distension Ext:    No edema; adequate peripheral perfusion Skin:      Warm and dry; no rash Neuro: Sedated, unresponsive.   Resolved Hospital Problem list     Assessment & Plan:  67 year old with end-stage renal disease, cardiac arrest, hyperkalemia after missed dialysis  Cardiac arrest Hypothermia protocol deferred as subdural hemorrhage is bigger Off levo fed, continue monitoring  Atrial fibrillation Hold flecainde Telemetry monitoring  End-stage renal disease Continue CVVH.  Diabetes Hold outpatient medication SSI coverage.  Prior CVA, H/O SDH in [place] in July 19 Seen by neurosurgery.  Not a surgical candidate.  Positive UA Recently treated as outpt. There are no signs of active infection Will follow cultures. Monitor off antibiotics Check Pct  Goals of care Discussed with his 2 sons at bedside.  We are concerned about anoxic injury and await neurologic recovery over the next few days In the meantime sons have indicated that they do not want him to suffer anymore Changed to limited code with no CPR or chest compressions  Disposition / Summary of Today's Plan 11/05/17   Continue CRRT, supportive care Monitor for neurologic recovery Made limited code    Diet: NPO Pain/Anxiety/Delirium protocol (if indicated):  DVT prophylaxis: SCDs. Holding lovenox due to SDH GI  prophylaxis: Pepcid Hyperglycemia protocol: SSI coverage Mobility: Bed rest Code Status: Limited code Family Communication: Sons updated at bedside  Labs   CBC: Recent Labs  Lab 10/29/17 1110  10/30/17 0425 11/01/2017 1308 11/13/2017 1312 11/05/2017 1616 11/05/17 0456  WBC 9.6  --  9.8 25.2*  --   --  12.6*  NEUTROABS 6.5  --   --   --   --   --   --   HGB 8.6*   < > 8.9* 8.7* 8.8* 10.2* 8.4*  HCT 28.9*   < > 29.8* 28.4*  26.0* 30.0* 26.9*  MCV 83.8  --  83.5 83.3  --   --  81.3  PLT 219  --  209 263  --   --  198   < > = values in this interval not displayed.    Basic Metabolic Panel: Recent Labs  Lab 10/29/17 1110  10/30/17 0425 11/11/2017 1308 11/03/2017 1312 11/08/2017 1556 11/09/2017 1616 11/05/17 0500  NA 136   < > 138 141 141 141 140 138  K 5.2*   < > 5.0 6.0* 6.2* 5.5* 5.5* 4.2  CL 100   < > 103 105 105 103 100 101  CO2 26  --  25 23  --  28  --  28  GLUCOSE 116*   < > 120* 153* 150* 133* 130* 101*  BUN 30*   < > 35* 57* 60* 63* 57* 36*  CREATININE 4.41*   < > 5.08* 5.58* 5.70* 5.94* 6.40* 3.53*  CALCIUM 8.5*  --  8.3* 11.1*  --  10.2  --  8.4*  MG  --   --   --  1.9  --   --   --  2.1  PHOS  --   --   --   --   --  8.6*  --  4.2   < > = values in this interval not displayed.   GFR: Estimated Creatinine Clearance: 21.1 mL/min (A) (by C-G formula based on SCr of 3.53 mg/dL (H)). Recent Labs  Lab 10/29/17 1110 10/30/17 0425 10/30/2017 1308 10/18/2017 1313 11/05/17 0456  WBC 9.6 9.8 25.2*  --  12.6*  LATICACIDVEN  --   --   --  5.25*  --     Liver Function Tests: Recent Labs  Lab 10/29/17 1110 10/30/17 0425 10/20/2017 1308 11/09/2017 1556 11/05/17 0500  AST 19 19 42*  --   --   ALT 10 9 13   --   --   ALKPHOS 78 79 97  --   --   BILITOT 0.6 0.4 0.4  --   --   PROT 5.2* 5.5* 5.4*  --   --   ALBUMIN 2.1* 2.1* 1.4* 1.7* 1.7*   No results for input(s): LIPASE, AMYLASE in the last 168 hours. No results for input(s): AMMONIA in the last 168 hours.  ABG    Component Value Date/Time   PHART 7.398 11/05/2017 0530   PCO2ART 46.9 11/05/2017 0530   PO2ART 103 11/05/2017 0530   HCO3 28.6 (H) 11/05/2017 0530   TCO2 30 11/15/2017 1616   O2SAT 97.4 11/05/2017 0530     Coagulation Profile: Recent Labs  Lab 10/29/17 1110 10/31/2017 1308 10/20/2017 1556 10/23/2017 2012  INR 1.16 1.24 1.18 1.23    Cardiac Enzymes: Recent Labs  Lab 10/18/2017 1308 11/05/2017 1556 11/11/2017 2006 11/05/17 0209  11/05/17 0911  TROPONINI 0.08* 0.23* 0.21* 0.19* 0.13*    HbA1C: No results found for: HGBA1C  CBG: Recent Labs  Lab 11/29/2017 1301 11-29-17 1948 11/29/17 2318 11/05/17 0401 11/05/17 0811  GLUCAP 104* 122* 119* 93 99   The patient is critically ill with multiple organ system failure and requires high complexity decision making for assessment and support, frequent evaluation and titration of therapies, advanced monitoring, review of radiographic studies and interpretation of complex data.   Critical Care Time devoted to patient care services, exclusive of separately billable procedures, described in this note is 45 minutes.   Chilton Greathouse MD [facility] 11/05/2017, 2:52 PM

## 2017-11-05 NOTE — Progress Notes (Signed)
[facility] Progress Note  Subjective: was not cooled overnight given changes on head CT w/ ^'d SDH.  Seen by neurosurg.  K + down and creat down on CRRT overnight.   Vitals:   11/05/17 0800 11/05/17 0900 11/05/17 1000 11/05/17 1100  BP: (!) 93/57 124/74 108/67 103/64  Pulse: 70 74 75 74  Resp:      Temp: 97.9 F (36.6 C) 98.1 F (36.7 C) 98.1 F (36.7 C) 97.9 F (36.6 C)  TempSrc: Core (Comment)     SpO2: 99% 100% 100% 98%  Weight:      Height:        Inpatient medications: . chlorhexidine gluconate (MEDLINE KIT)  15 mL Mouth Rinse BID  . Chlorhexidine Gluconate Cloth  6 each Topical Daily  . Chlorhexidine Gluconate Cloth  6 each Topical Q0600  . fentaNYL (SUBLIMAZE) injection  50 mcg Intravenous Once  . insulin aspart  0-9 Units Subcutaneous Q4H  . mouth rinse  15 mL Mouth Rinse 10 times per day  . mupirocin ointment  1 application Nasal BID  . sodium chloride flush  10-40 mL Intracatheter Q12H   . sodium chloride 10 mL/hr at 11/05/17 1123  . famotidine (PEPCID) IV 20 mg (11/16/2017 1744)  . fentaNYL infusion INTRAVENOUS 100 mcg/hr (11/05/17 1100)  . norepinephrine (LEVOPHED) Adult infusion    . dialysis replacement fluid (prismasate) 400 mL/hr at 11/05/17 0700  . dialysis replacement fluid (prismasate) 200 mL/hr at 11/05/17 1018  . dialysate (PRISMASATE) 2,000 mL/hr at 11/05/17 1125  . sodium chloride 999 mL/hr at 10/23/2017 1715   alteplase, fentaNYL, heparin, sodium chloride, sodium chloride flush  Iron/TIBC/Ferritin/ %Sat No results found for: IRON, TIBC, FERRITIN, IRONPCTSAT  Exam: Gen: Intubated/ventilated; not responsive, no tracking, eyes open staring up No rash, cyanosis or gangrene Chest coarse BS bilat, on wheezing RRR, no murmur Abd soft, no obvious masses Ext 1+ B LE edema and 1+ B UE edema Neuro: not responding   Home meds:  - aspirin 81/ atorvastatin 40  - flecainide 100 bid  - minoxidil 2.5 bid  - gabapentin 100 bid/ hydrocodone  w acetaminophen 5- 325 qid prn  - calc acetate ac tid/ allopurinol 100/ colchicine 0.6 qd/ omeprazole 20  - insulin detemir 10 bid  CXR - clear, no edema  Dialysis:  TTS at [facility], just started there 10/28/17 after moving from [place], [place] 4hr, 400/800, EDW 82.5kg, 2K/2.25Ca, TDC, heparin 2000 bolus - Hectoral 24mg IV q HD - Venofer '50mg'$  IV weekly - Mircera 1040m IV q 2 weeks (has not been given yet)   Impression/ Plan: 1. Cardiac arrest: Prolonged down time, possibly d/t hyperkalemia. Not responding.  2. ESRD - cont CRRT for now, using TDC 3. Shock - post arrest, resolved, BP's soft 4. Subdural hematoma: acute/ chronic by CT last night, seen by NSurg no plans for surgery 5. Hyperkalemia: resolved , K+ 4's 6. Volume - mild vol excess/ edema on exam, CXR clear, BP's soft 7. Anemia of ESRD: Hgb 8.8, start ESA tomorrow. 8. Hypothermia: sepsis w/u initiated by primary team, cx's pending 9. T2 DM 10.  Hx CVA/ R sided deficits: [facility] family at [facility] could sit on side of bed. Some speech difficulties since CVA in July as well   RoKelly SplinterD [place]ager [phone] 11/05/2017, 11:35 AM   Recent Labs  Lab 10/31/2017 1556 11/05/2017 1616 10/22/2017 2012 11/05/17 0500  NA 141 140  --  138  K 5.5* 5.5*  --  4.2  CL 103 100  --  101  CO2 28  --   --  28  GLUCOSE 133* 130*  --  101*  BUN 63* 57*  --  36*  CREATININE 5.94* 6.40*  --  3.53*  CALCIUM 10.2  --   --  8.4*  PHOS 8.6*  --   --  4.2  ALBUMIN 1.7*  --   --  1.7*  INR 1.18  --  1.23  --    Recent Labs  Lab 10/30/17 0425 11/15/2017 1308  AST 19 42*  ALT 9 13  ALKPHOS 79 97  BILITOT 0.4 0.4  PROT 5.5* 5.4*   Recent Labs  Lab 10/29/2017 1308  11/05/2017 1616 11/05/17 0456  WBC 25.2*  --   --  12.6*  HGB 8.7*   < > 10.2* 8.4*  HCT 28.4*   < > 30.0* 26.9*  MCV 83.3  --   --  81.3  PLT 263  --   --  198   < > = values in this interval not displayed.

## 2017-11-05 NOTE — Plan of Care (Signed)
  Problem: Clinical Measurements: Goal: Ability to maintain clinical measurements within normal limits will improve 11/05/2017 0424 by Shary Decamp, RN Outcome: Progressing 11/05/2017 0423 by Shary Decamp, RN Outcome: Not Progressing Goal: Diagnostic test results will improve 11/05/2017 0424 by Shary Decamp, RN Outcome: Progressing 11/05/2017 0423 by Shary Decamp, RN Outcome: Not Progressing Goal: Respiratory complications will improve 11/05/2017 0424 by Shary Decamp, RN Outcome: Progressing 11/05/2017 0423 by Shary Decamp, RN Outcome: Not Progressing Goal: Cardiovascular complication will be avoided 11/05/2017 0424 by Shary Decamp, RN Outcome: Progressing 11/05/2017 0423 by Shary Decamp, RN Outcome: Not Progressing

## 2017-11-06 ENCOUNTER — Inpatient Hospital Stay (HOSPITAL_COMMUNITY): Payer: Medicare Other

## 2017-11-06 DIAGNOSIS — J9600 Acute respiratory failure, unspecified whether with hypoxia or hypercapnia: Secondary | ICD-10-CM

## 2017-11-06 DIAGNOSIS — N186 End stage renal disease: Secondary | ICD-10-CM

## 2017-11-06 DIAGNOSIS — G9340 Encephalopathy, unspecified: Secondary | ICD-10-CM

## 2017-11-06 DIAGNOSIS — Z992 Dependence on renal dialysis: Secondary | ICD-10-CM

## 2017-11-06 LAB — GLUCOSE, CAPILLARY
GLUCOSE-CAPILLARY: 100 mg/dL — AB (ref 70–99)
GLUCOSE-CAPILLARY: 78 mg/dL (ref 70–99)
GLUCOSE-CAPILLARY: 89 mg/dL (ref 70–99)
Glucose-Capillary: 96 mg/dL (ref 70–99)
Glucose-Capillary: 81 mg/dL (ref 70–99)
Glucose-Capillary: 64 mg/dL — ABNORMAL LOW (ref 70–99)
Glucose-Capillary: 64 mg/dL — ABNORMAL LOW (ref 70–99)
Glucose-Capillary: 70 mg/dL (ref 70–99)
Glucose-Capillary: 78 mg/dL (ref 70–99)
Glucose-Capillary: 84 mg/dL (ref 70–99)

## 2017-11-06 LAB — PROCALCITONIN: Procalcitonin: 23.32 ng/mL

## 2017-11-06 LAB — RENAL FUNCTION PANEL
ALBUMIN: 1.7 g/dL — AB (ref 3.5–5.0)
ANION GAP: 7 (ref 5–15)
Albumin: 1.9 g/dL — ABNORMAL LOW (ref 3.5–5.0)
Anion gap: 8 (ref 5–15)
BUN: 17 mg/dL (ref 8–23)
BUN: 10 mg/dL (ref 8–23)
CALCIUM: 8.2 mg/dL — AB (ref 8.9–10.3)
CHLORIDE: 104 mmol/L (ref 98–111)
CHLORIDE: 105 mmol/L (ref 98–111)
CO2: 26 mmol/L (ref 22–32)
CO2: 25 mmol/L (ref 22–32)
CREATININE: 2.21 mg/dL — AB (ref 0.61–1.24)
Calcium: 7.9 mg/dL — ABNORMAL LOW (ref 8.9–10.3)
Creatinine, Ser: 1.78 mg/dL — ABNORMAL HIGH (ref 0.61–1.24)
GFR calc Af Amer: 44 mL/min — ABNORMAL LOW (ref >60)
GFR calc non Af Amer: 38 mL/min — ABNORMAL LOW (ref >60)
GFR, EST AFRICAN AMERICAN: 34 mL/min — AB (ref >60)
GFR, EST NON AFRICAN AMERICAN: 29 mL/min — AB (ref >60)
GLUCOSE: 95 mg/dL (ref 70–99)
Glucose, Bld: 72 mg/dL (ref 70–99)
PHOSPHORUS: 3.7 mg/dL (ref 2.5–4.6)
Phosphorus: 2.6 mg/dL (ref 2.5–4.6)
Potassium: 3.2 mmol/L — ABNORMAL LOW (ref 3.5–5.1)
Potassium: 3.8 mmol/L (ref 3.5–5.1)
Sodium: 138 mmol/L (ref 135–145)
Sodium: 137 mmol/L (ref 135–145)

## 2017-11-06 LAB — URINE CULTURE: CULTURE: NO GROWTH

## 2017-11-06 LAB — APTT: APTT: 46 s — AB (ref 24–36)

## 2017-11-06 LAB — CBC
HEMATOCRIT: 26.3 % — AB (ref 39.0–52.0)
Hemoglobin: 7.8 g/dL — ABNORMAL LOW (ref 13.0–17.0)
MCH: 25 pg — AB (ref 26.0–34.0)
MCHC: 29.7 g/dL — AB (ref 30.0–36.0)
MCV: 84.3 fL (ref 80.0–100.0)
NRBC: 0 % (ref 0.0–0.2)
PLATELETS: 175 10*3/uL (ref 150–400)
RBC: 3.12 MIL/uL — ABNORMAL LOW (ref 4.22–5.81)
RDW: 16.1 % — AB (ref 11.5–15.5)
WBC: 14.9 10*3/uL — ABNORMAL HIGH (ref 4.0–10.5)

## 2017-11-06 LAB — PHOSPHORUS: Phosphorus: 3.5 mg/dL (ref 2.5–4.6)

## 2017-11-06 LAB — MAGNESIUM: MAGNESIUM: 2.2 mg/dL (ref 1.7–2.4)

## 2017-11-06 MED ORDER — POTASSIUM CHLORIDE 20 MEQ/15ML (10%) PO SOLN
40.0000 meq | Freq: Once | ORAL | Status: AC
  Administered 2017-11-06: 40 meq
  Filled 2017-11-06: qty 30

## 2017-11-06 MED ORDER — VITAL HIGH PROTEIN PO LIQD: 1000.0000 mL | ORAL | Status: DC

## 2017-11-06 MED ORDER — DEXTROSE-NACL 5-0.9 % IV SOLN
INTRAVENOUS | Status: DC
  Administered 2017-11-06: 06:00:00 via INTRAVENOUS

## 2017-11-06 MED ORDER — VITAL HIGH PROTEIN PO LIQD
1000.0000 mL | ORAL | Status: DC
  Administered 2017-11-06 – 2017-11-08 (×3): 1000 mL
  Filled 2017-11-06 (×2): qty 1000

## 2017-11-06 MED ORDER — PRO-STAT SUGAR FREE PO LIQD
30.0000 mL | Freq: Three times a day (TID) | ORAL | Status: DC
  Administered 2017-11-06 – 2017-11-09 (×9): 30 mL
  Filled 2017-11-06 (×8): qty 30

## 2017-11-06 MED ORDER — DEXTROSE 50 % IV SOLN
1.0000 | Freq: Once | INTRAVENOUS | Status: AC
  Administered 2017-11-06: 25 mL via INTRAVENOUS
  Filled 2017-11-06: qty 50

## 2017-11-06 NOTE — Progress Notes (Signed)
  [facility] Progress Note    Subjective:   Intubated and unresponsive   Objective:   BP 109/63   Pulse 79   Temp (!) 97.2 F (36.2 C) (Esophageal)   Resp 18   Ht _0  (1.702 m)   Wt 83.2 kg   SpO2 100%   BMI 28.73 kg/m   Intake/Output: I/O last 3 completed shifts: In: 2033 [I.V.:1933; Other:25; IV Piggyback:75] Out: 4097 [Urine:60; DZHGD:9242]   Intake/Output this shift:  Total I/O In: 201.1 [I.V.:201.1] Out: 209 [Other:209] Weight change: -7.5 kg  Physical Exam: Gen: Intubated and unresponsive CVS: no rub Resp: scattered rhonchi Abd: +BS, soft, NT/ND Ext: 3+ edema of right leg 1 + on left  Labs: BMET Recent Labs  Lab 10/17/2017 1308 10/18/2017 1312 11/09/2017 1556 10/26/2017 1616 11/05/17 0500 11/05/17 1601 11/06/17 0429  NA 141 141 141 140 138 137 138  K 6.0* 6.2* 5.5* 5.5* 4.2 3.5 3.2*  CL 105 105 103 100 101 102 104  CO2 23  --  28  --  _1 GLUCOSE 153* 150* 133* 130* 101* 98 72  BUN 57* 60* 63* 57* 36* 24* 17  CREATININE 5.58* 5.70* 5.94* 6.40* 3.53* 2.60* 2.21*  ALBUMIN 1.4*  --  1.7*  --  1.7* 1.8* 1.7*  CALCIUM 11.1*  --  10.2  --  8.4* 8.2* 8.2*  PHOS  --   --  8.6*  --  4.2 4.3 3.5  3.7   CBC Recent Labs  Lab 11/14/2017 1308 10/19/2017 1312 10/21/2017 1616 11/05/17 0456 11/06/17 0429  WBC 25.2*  --   --  12.6* 14.9*  HGB 8.7* 8.8* 10.2* 8.4* 7.8*  HCT 28.4* 26.0* 30.0* 26.9* 26.3*  MCV 83.3  --   --  81.3 84.3  PLT 263  --   --  198 175    _2 @ Medications:    . chlorhexidine gluconate (MEDLINE KIT)  15 mL Mouth Rinse BID  . Chlorhexidine Gluconate Cloth  6 each Topical Daily  . Chlorhexidine Gluconate Cloth  6 each Topical Q0600  . fentaNYL (SUBLIMAZE) injection  50 mcg Intravenous Once  . insulin aspart  0-9 Units Subcutaneous Q4H  . mouth rinse  15 mL Mouth Rinse 10 times per day  . mupirocin ointment  1 application Nasal BID  . sodium chloride flush  10-40 mL Intracatheter Q12H   Dialysis: TTS  at [facility], just started there 10/28/17 after moving from [place], [place] 4hr, 400/800, EDW 82.5kg, 2K/2.25Ca, TDC, heparin 2000 bolus - Hectoral 48mg IV q HD - Venofer 526mIV weekly - Mircera 10076mIV q 2 weeks (has not been given yet)    Assessment/ Plan:   1. Cardiac arrest- prolonged down time with poor neurologic recovery 2. Subdural hematoma- small increase but no surgical intervention per Neurosurgery. 3. Embolic L MMA CVA 4. ESRD currently on CVVHD due to shock and hypotension with volume overload 5. 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 Nutrition: renal  8. Hypertension: low bp and off meds 9. Disposition- poor overall prognosis.  Await family meeting to help set goals/limits of care and possible transition to terminal wean.  JosDonetta PottsD Car[place] Heightsger ([phone]/21/2019, 10:32 AM

## 2017-11-06 NOTE — Progress Notes (Signed)
Spoke with Nephrology regarding issues with CRRT. Fliter changed x2 and machine changed out. Still having issues with access and return pressures. Permission given to stop therapy if alarms continue after interventions.

## 2017-11-06 NOTE — Progress Notes (Signed)
CBG 64. 1/2 amp of 50% dextrose given. CBG up to 89. MD made aware of slow decline in cbg, new orders given.

## 2017-11-06 NOTE — Progress Notes (Signed)
Initial Nutrition Assessment  DOCUMENTATION CODES:   Not applicable  INTERVENTION:  - If patient to remain intubated >/= 24 hours and if within GOC, recommend TF initiation. - Recommend Vital High Protein @ 60 mL/hr with 30 mL Prostat TID. This regimen will provide 1740 kcal (102% estimated kcal need), 171 grams of protein, and 1204 mL free water.   NUTRITION DIAGNOSIS:   Inadequate oral intake related to inability to eat as evidenced by NPO status.  GOAL:   Patient will meet greater than or equal to 90% of their needs  MONITOR:   Vent status, Weight trends, Labs, Skin, I & O's  REASON FOR ASSESSMENT:   Ventilator  ASSESSMENT:   67 year old with PMH of ESRD started on HD 07/2017, atrial fibrillation. type 2 DM, CVA with Rhemiparesis, chronic subdural hematoma, gout, GERD, and hyperlipidemia. He was admitted for asystolic cardiac arrest after missing dialysis. Total downtime approximately at least 20 minutes.  Noted to be hyperkalemic after resuscitation. Recently moved here from [place] so not much information available in the chart. Patient intubated, OGT placed, and started on CRRT.  BMI indicates overweight status, but may be falsely in this range d/t +4 edema to all extremities. Patient is intubated with OGT to LIS; no output noted at this time. He was started on CRRT 10/19.   Patient's two sons are at bedside. They report that patient has a very good appetite at baseline but that starting last Wednesday (10/16) his appetite declined. He only ate about half of his normal amount at dinner on Friday (10/18) and did not eat anything for breakfast or lunch on Saturday (10/19) while sons were with him. They report he was recently diagnosed with a UTI and started on antibiotics; could have contributed to decreased appetite or to taste changes leading to decreased desire to eat.   They report that weight has been stable for at least the past week, but that patient seemed to have lost  weight from the time they saw him in August until seeing him last week. Will monitor weight trends closely.  Per Paul's, CCM NP, note earlier this AM: patient remains encephalopathic this AM, hypothermia protocol deferred d/t increased subdural hemorrhage, plan for EEG once sedation weaned, plan to continue CRRT, prior CVA and not a surgical candidate. Patient is now limited code with no CPR or chest compressions.    Patient is currently intubated on ventilator support MV: 9.3 L/min Temp (24hrs), Avg:97.3 F (36.3 C), Min:96.4 F (35.8 C), Max:97.9 F (36.6 C) Propofol: none BP: 160/85 and MAP: 106   Medications reviewed; 20 mg IV Pepcid/day, sliding scale Novolog, 40 mEq KCl per OGT x1 dose today. Labs reviewed; CBGs: 64 x2, 89, and 70 mg/dL today, K: 3.2 mmol/L, creatinine: 2.21 mg/dL, Ca: 8.2 mg/dL, GFR: 34 mL/min.  IVF; D5-NS @ 50 mL/hr (204 kcal). Drip; Fentanyl @ 100 mcg/hr.      NUTRITION - FOCUSED PHYSICAL EXAM:  Completed; no muscle or fat wasting noted, but patient with +4 edema to all extremities which may be masking any wasting.  Diet Order:   Diet Order    None      EDUCATION NEEDS:   No education needs have been identified at this time  Skin:  Skin Assessment: Skin Integrity Issues: Skin Integrity Issues:: Stage I Stage I: coccyx  Last BM:  PTA/unknown  Height:   Ht Readings from Last 1 Encounters:  12-04-2017 5\' 7"  (1.702 m)    Weight:   Wt Readings from  Last 1 Encounters:  11/06/17 83.2 kg    Ideal Body Weight:  67.27 kg  BMI:  Body mass index is 28.73 kg/m.  Estimated Nutritional Needs:   Kcal:  1701  Protein:  166 grams (2 grams/kg)  Fluid:  >/= 1.2 L/day     Guy Gammon, MS, RD, LDN, [facility] Inpatient Clinical Dietitian Pager # [phone] After hours/weekend pager # [phone]

## 2017-11-06 NOTE — Progress Notes (Signed)
EEG complete - results pending 

## 2017-11-06 NOTE — Procedures (Signed)
ELECTROENCEPHALOGRAM REPORT   Patient: Guy Fry       Room #: 2H26C EEG No. ID: [id number] Age: 67 y.o.        Sex: male Referring Physician: Mannam Report Date:  11/06/2017        Interpreting Physician: Thana Farr  History: Guy Fry is an 67 y.o. male s/p arrest  Medications:  Pepcid, Fentanyl, Insulin, Levophed  Conditions of Recording:  This is a 21 channel routine scalp EEG performed with bipolar and monopolar montages arranged in accordance to the international 10/20 system of electrode placement. One channel was dedicated to EKG recording.  The patient is in the intubated and sedated state.  Description:  Artifact is prominent during the recording often obscuring the background rhythm. When able to be visualized the background is slow and poorly organized.   It consists of a low voltage, polymorphic delta rhythm that is diffusely distributed and continuous.  There are also noted some periodic intermittent discharges of triphasic morphology.  Hyperventilation and intermittent photic stimulation were not performed.   IMPRESSION: This is an abnormal electroencephalogram due to general background slowing and the presence of triphasic waves.  This is most consistent with a metabolic encephalopathy of nonspecific etiology.  Although most often seen with hepatic encephalopathies, it is not isolated to this clinical scenario.  Clinical correlation recommended.     Thana Farr, MD Neurology [phone] 11/06/2017, 2:58 PM

## 2017-11-06 NOTE — Progress Notes (Signed)
NUTRITION NOTE  Patient seen by this RD earlier today with associated note at 10:56 AM. Consult received for initiation and management of TF.   Will order TF per recommendations left in earlier note: Vital High Protein @ 60 mL/hr with 30 mL Prostat TID. This regimen will provide 1740 kcal (102% estimated kcal need), 171 grams of protein, and 1204 mL free water.    Trenton Gammon, MS, RD, LDN, [facility] Inpatient Clinical Dietitian Pager # [phone] After hours/weekend pager # [phone]

## 2017-11-06 NOTE — Progress Notes (Addendum)
NAME:  Guy Fry, MRN:  [id number], DOB:  [birth year], LOS: 2 ADMISSION DATE:  11-14-2017, CONSULTATION DATE:  11-14-2017 REFERRING MD:  EDP, CHIEF COMPLAINT:  Cardiac arrest  Brief History   67 year old with end-stage renal disease.  Admitted for asystolic cardiac arrest after missed dialysis Total downtime approximately atleast 20 mins.  Noted to be hyperkalemic after resuscitation. Recently moved here from [place].  Not much history in the chart.  Past Medical History  End-stage renal disease, atrial fibrillation, diabetes type 2, CVA with right hemiparesis, chronic subdural hematoma, gout, GERD, hyperlipidemia. Significant Hospital Events   10/19- Admit with PEA arrest, started CRRT 10/21 - family wishing to pursue more diagnostic testing.   Consults: date of consult/date signed off & final recs:  Nephrology 11-14-2017 >  Procedures (surgical and bedside):  ETT 10/19 >   Significant Diagnostic Tests:  CT head 10/19 > increasing size of subdural hematoma with local mass-effect.  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  Micro Data:  Bcx 10/19 Spcx 10/19 Urine culture 10/20  Antimicrobials:    Subjective:  Remains CRRT  Objective   Blood pressure 109/63, pulse 79, temperature (!) 97.2 F (36.2 C), temperature source Esophageal, resp. rate 18, height 5\' 7"  (1.702 m), weight 83.2 kg, SpO2 100 %. CVP:  [10 mmHg] 10 mmHg  Vent Mode: PRVC FiO2 (%):  [40 %] 40 % Set Rate:  [18 bmp] 18 bmp Vt Set:  [530 mL] 530 mL PEEP:  [5 cmH20] 5 cmH20 Plateau Pressure:  [17 cmH20-20 cmH20] 17 cmH20   Intake/Output Summary (Last 24 hours) at 11/06/2017 1036 Last data filed at 11/06/2017 1000 Gross per 24 hour  Intake 1122.88 ml  Output 1107 ml  Net 15.88 ml   Filed Weights   11-14-2017 1600 11/05/17 0500 11/06/17 0400  Weight: 85.2 kg 82.4 kg 83.2 kg   Examination: Gen:     Elderly critically ill appearing male in NAD HEENT:  [place]/AT, PERRL, no JVD Lungs:    Clear  bilateral breath sounds CV:         RRR, no MRG Abd:      Soft, non-tender, non-distended. Normoactive Ext:    No edema; adequate peripheral perfusion Skin:      Warm and dry; no rash Neuro:  Sedation weaned down. Eyes open to verbal, no reaction to suctioning. Blinks to threat and corneal intact. Does not follow commands.   Resolved Hospital Problem list     Assessment & Plan:  67 year old with end-stage renal disease, cardiac arrest, hyperkalemia after missed dialysis  Cardiac arrest: PEA 30 mins. Felt to be secondary to hyperkalemia after missing HD. He remains encephalopathic secondary to this. Hypothermia protocol deferred as subdural hemorrhage was bigger on presentation. Son reports a fall. - Cardiac monitoring - Wean sedation today - EEG once sedation weaned - Family would wish to pursue MRI if EEG uncertain.  - Neuro checks.   Atrial fibrillation: rate has remained controlled.  - Holding flecainde   End-stage renal disease - Continue CVVH. - Nephrology following  Diabetes: has been hypoglycemic at times since admission.  - Hold outpatient medication - SSI coverage. - Maintenance fluids with D5  Prior CVA, H/O SDH in [place] in July 19 - Seen by neurosurgery.  Not a surgical candidate.  Positive UA: Recently treated as outpt. There are no signs of active infection - Will follow cultures.  - Monitor off antibiotics. PCT up, low threshold to initiate.    Goals of care Discussed with  his 2 sons at bedside 10/20.  We are concerned about anoxic injury and await neurologic recovery over the next few days. Changed to limited code with no CPR or chest compressions based on that conversation. Wish to pursue further diagnostic testing.   Disposition / Summary of Today's Plan 11/06/17   Continue CRRT, supportive care Monitor for neurologic recovery Made limited code    Diet: NPO Pain/Anxiety/Delirium protocol (if indicated):  DVT prophylaxis: SCDs. Holding lovenox due  to SDH GI prophylaxis: Pepcid Hyperglycemia protocol: SSI coverage Mobility: Bed rest Code Status: Limited code Family Communication: Sons updated at bedside 10/21  Labs   CBC: Recent Labs  Lab Nov 07, 2017 1308 2017-11-07 1312 11-07-2017 1616 11/05/17 0456 11/06/17 0429  WBC 25.2*  --   --  12.6* 14.9*  HGB 8.7* 8.8* 10.2* 8.4* 7.8*  HCT 28.4* 26.0* 30.0* 26.9* 26.3*  MCV 83.3  --   --  81.3 84.3  PLT 263  --   --  198 175    Basic Metabolic Panel: Recent Labs  Lab 07-Nov-2017 1308  07-Nov-2017 1556 07-Nov-2017 1616 11/05/17 0500 11/05/17 1601 11/06/17 0429  NA 141   < > 141 140 138 137 138  K 6.0*   < > 5.5* 5.5* 4.2 3.5 3.2*  CL 105   < > 103 100 101 102 104  CO2 23  --  28  --  28 27 26   GLUCOSE 153*   < > 133* 130* 101* 98 72  BUN 57*   < > 63* 57* 36* 24* 17  CREATININE 5.58*   < > 5.94* 6.40* 3.53* 2.60* 2.21*  CALCIUM 11.1*  --  10.2  --  8.4* 8.2* 8.2*  MG 1.9  --   --   --  2.1  --  2.2  PHOS  --   --  8.6*  --  4.2 4.3 3.5  3.7   < > = values in this interval not displayed.   GFR: Estimated Creatinine Clearance: 33.9 mL/min (A) (by C-G formula based on SCr of 2.21 mg/dL (H)). Recent Labs  Lab 11-07-17 1308 11-07-17 1313 11/05/17 0456 11/05/17 1601 11/06/17 0429  PROCALCITON  --   --   --  20.47 23.32  WBC 25.2*  --  12.6*  --  14.9*  LATICACIDVEN  --  5.25*  --   --   --     Liver Function Tests: Recent Labs  Lab November 07, 2017 1308 November 07, 2017 1556 11/05/17 0500 11/05/17 1601 11/06/17 0429  AST 42*  --   --   --   --   ALT 13  --   --   --   --   ALKPHOS 97  --   --   --   --   BILITOT 0.4  --   --   --   --   PROT 5.4*  --   --   --   --   ALBUMIN 1.4* 1.7* 1.7* 1.8* 1.7*   No results for input(s): LIPASE, AMYLASE in the last 168 hours. No results for input(s): AMMONIA in the last 168 hours.  ABG    Component Value Date/Time   PHART 7.398 11/05/2017 0530   PCO2ART 46.9 11/05/2017 0530   PO2ART 103 11/05/2017 0530   HCO3 28.6 (H) 11/05/2017 0530    TCO2 30 11-07-2017 1616   O2SAT 97.4 11/05/2017 0530     Coagulation Profile: Recent Labs  Lab 11-07-17 1308 11/07/17 1556 11-07-2017 2012  INR 1.24 1.18 1.23  Cardiac Enzymes: Recent Labs  Lab 11/01/2017 1308 10/21/2017 1556 10/23/2017 2006 11/05/17 0209 11/05/17 0911  TROPONINI 0.08* 0.23* 0.21* 0.19* 0.13*    HbA1C: No results found for: HGBA1C  CBG: Recent Labs  Lab 11/05/17 2305 11/06/17 0324 11/06/17 0528 11/06/17 0601 11/06/17 0729  GLUCAP 81 64* 64* 89 70   My critical care time excluding procedures: 30 minutes   Joneen Roach, AGACNP-BC [facility] [place] [facility] Pulmonary/Critical Care Pager [phone] or [phone]  11/06/2017 10:46 AM

## 2017-11-07 ENCOUNTER — Inpatient Hospital Stay (HOSPITAL_COMMUNITY): Payer: Medicare Other

## 2017-11-07 DIAGNOSIS — R609 Edema, unspecified: Secondary | ICD-10-CM

## 2017-11-07 LAB — RENAL FUNCTION PANEL
Albumin: 1.7 g/dL — ABNORMAL LOW (ref 3.5–5.0)
Albumin: 1.6 g/dL — ABNORMAL LOW (ref 3.5–5.0)
Anion gap: 6 (ref 5–15)
Anion gap: 5 (ref 5–15)
BUN: 12 mg/dL (ref 8–23)
BUN: 20 mg/dL (ref 8–23)
CHLORIDE: 108 mmol/L (ref 98–111)
CO2: 25 mmol/L (ref 22–32)
CO2: 26 mmol/L (ref 22–32)
CREATININE: 1.8 mg/dL — AB (ref 0.61–1.24)
CREATININE: 2.23 mg/dL — AB (ref 0.61–1.24)
Calcium: 7.9 mg/dL — ABNORMAL LOW (ref 8.9–10.3)
Calcium: 7.9 mg/dL — ABNORMAL LOW (ref 8.9–10.3)
Chloride: 107 mmol/L (ref 98–111)
GFR calc non Af Amer: 38 mL/min — ABNORMAL LOW (ref >60)
GFR, EST AFRICAN AMERICAN: 44 mL/min — AB (ref >60)
GFR, EST AFRICAN AMERICAN: 34 mL/min — AB (ref >60)
GFR, EST NON AFRICAN AMERICAN: 29 mL/min — AB (ref >60)
GLUCOSE: 120 mg/dL — AB (ref 70–99)
Glucose, Bld: 133 mg/dL — ABNORMAL HIGH (ref 70–99)
POTASSIUM: 3 mmol/L — AB (ref 3.5–5.1)
Phosphorus: 2.3 mg/dL — ABNORMAL LOW (ref 2.5–4.6)
Phosphorus: 1.3 mg/dL — ABNORMAL LOW (ref 2.5–4.6)
Potassium: 3.7 mmol/L (ref 3.5–5.1)
SODIUM: 138 mmol/L (ref 135–145)
Sodium: 139 mmol/L (ref 135–145)

## 2017-11-07 LAB — MAGNESIUM: MAGNESIUM: 2.3 mg/dL (ref 1.7–2.4)

## 2017-11-07 LAB — CULTURE, RESPIRATORY

## 2017-11-07 LAB — CBC
HCT: 22.2 % — ABNORMAL LOW (ref 39.0–52.0)
HEMATOCRIT: 21.2 % — AB (ref 39.0–52.0)
HEMOGLOBIN: 6.6 g/dL — AB (ref 13.0–17.0)
HEMOGLOBIN: 6.1 g/dL — AB (ref 13.0–17.0)
MCH: 25.5 pg — AB (ref 26.0–34.0)
MCH: 24.7 pg — ABNORMAL LOW (ref 26.0–34.0)
MCHC: 29.7 g/dL — ABNORMAL LOW (ref 30.0–36.0)
MCHC: 28.8 g/dL — ABNORMAL LOW (ref 30.0–36.0)
MCV: 85.7 fL (ref 80.0–100.0)
MCV: 85.8 fL (ref 80.0–100.0)
NRBC: 0 % (ref 0.0–0.2)
Platelets: 141 10*3/uL — ABNORMAL LOW (ref 150–400)
Platelets: 143 10*3/uL — ABNORMAL LOW (ref 150–400)
RBC: 2.59 MIL/uL — ABNORMAL LOW (ref 4.22–5.81)
RBC: 2.47 MIL/uL — AB (ref 4.22–5.81)
RDW: 16.5 % — ABNORMAL HIGH (ref 11.5–15.5)
RDW: 16.2 % — AB (ref 11.5–15.5)
WBC: 15.5 10*3/uL — ABNORMAL HIGH (ref 4.0–10.5)
WBC: 15.5 10*3/uL — AB (ref 4.0–10.5)
nRBC: 0 % (ref 0.0–0.2)

## 2017-11-07 LAB — PROCALCITONIN: Procalcitonin: 12.45 ng/mL

## 2017-11-07 LAB — CULTURE, RESPIRATORY W GRAM STAIN

## 2017-11-07 LAB — PHOSPHORUS: PHOSPHORUS: 2.3 mg/dL — AB (ref 2.5–4.6)

## 2017-11-07 LAB — GLUCOSE, CAPILLARY
GLUCOSE-CAPILLARY: 138 mg/dL — AB (ref 70–99)
GLUCOSE-CAPILLARY: 125 mg/dL — AB (ref 70–99)
Glucose-Capillary: 86 mg/dL (ref 70–99)
Glucose-Capillary: 113 mg/dL — ABNORMAL HIGH (ref 70–99)
Glucose-Capillary: 146 mg/dL — ABNORMAL HIGH (ref 70–99)

## 2017-11-07 LAB — APTT: APTT: 39 s — AB (ref 24–36)

## 2017-11-07 LAB — ABO/RH: ABO/RH(D): O POS

## 2017-11-07 LAB — PREPARE RBC (CROSSMATCH)

## 2017-11-07 MED ORDER — SODIUM CHLORIDE 0.9% IV SOLUTION: Freq: Once | INTRAVENOUS | Status: DC

## 2017-11-07 MED ORDER — POTASSIUM CHLORIDE 20 MEQ/15ML (10%) PO SOLN
20.0000 meq | Freq: Once | ORAL | Status: AC
  Administered 2017-11-07: 20 meq via ORAL
  Filled 2017-11-07: qty 15

## 2017-11-07 MED ORDER — CHLORHEXIDINE GLUCONATE CLOTH 2 % EX PADS: 6.0000 | MEDICATED_PAD | Freq: Every day | CUTANEOUS | Status: DC

## 2017-11-07 NOTE — Progress Notes (Signed)
Received in report to stop treatment if access problems continued to occur. CRRT filter clotted shortly after, [place], MD notified for confirmation of discontinuation of treatment. Treatment D/C, HD catheter Heparin locked, patient tolerating well. Will continue to monitor.

## 2017-11-07 NOTE — Progress Notes (Signed)
Critical hemoglobin result reported to [facility]. Waiting on new orders.

## 2017-11-07 NOTE — Progress Notes (Signed)
PCCM INTERVAL PROGRESS NOTE  Called by vascular sonographer to alert me to the presence of DVT in right lower extremity. The patient has SDH and is not a candidate for anticoagulation.  Plan: Consult placed to IR for IVC filter  Joneen Roach, AGACNP-BC [facility] Pulmonary/Critical Care Pager [phone] or [phone]  11/07/2017 4:53 PM

## 2017-11-07 NOTE — Progress Notes (Signed)
67 year old with end-stage renal disease admitted 10/19 with asystole cardiac arrest after missed dialysis, noted to be hyperkalemic. Hypothermia was deferred due to chronic subdural hematoma He was noted to follow simple commands on 10/21.  On exam-chronically ill appearing, less responsive, does not follow commands today, moves upper extremities to deep pain stimulus, decreased breath sounds bilateral, S1-S2 normal, no S3, 1+ edema.  Chest x-ray reviewed personally, no new infiltrates Labs reviewed which show hypokalemia and hypophosphatemia, extremely low albumin at 1.6, leukocytosis and anemia. Venous duplex shows DVT in right lower extremity  Impression/plan  Acute encephalopathy-at risk for anoxic injury but encouraging that he was following commands on 10/21.  EEG does not show any evidence of seizures.  Acute respiratory failure-initiate spontaneous breathing trials but obviously mental status would not improve further for [place] to consider successful extubation Use low-dose fentanyl intermittently with goal RA SS -1  ESRD -discontinue CRRT and transition to intermittent hemodialysis.  New finding of RLE DVT-poor candidate for long-term anticoagulation due to subdural hematoma.  Is it possible that he had a pulmonary embolism ?  We will proceed with placement of IVC filter  Discussed with sons at bedside, they agree that he would not want long-term ventilation.  We will try to optimize neurological status over the next few days and then proceed with trial of one-way extubation  My critical care time x 43m  Cyril Mourning MD. FCCP. [place] [facility] & Critical [facility] Pager [phone] If no response call [phone]   11/07/2017

## 2017-11-07 NOTE — Progress Notes (Signed)
Right lower extremity venous duplex completed. Positive for an acute occlusive DVT coursing through the posterior tibial vens and popliteal vein. Unable to evaluate the peroneal veins due to edema.  [employer], RVS 11/07/2017 4:32 PM

## 2017-11-07 NOTE — Progress Notes (Signed)
NAME:  Guy Fry, MRN:  [id number], DOB:  01-10-1951, LOS: 3 ADMISSION DATE:  11/15/2017, CONSULTATION DATE:  11/11/2017 REFERRING MD:  EDP, CHIEF COMPLAINT:  Cardiac arrest  Brief History   67 year old with end-stage renal disease.  Admitted for asystolic cardiac arrest after missed dialysis Total downtime approximately atleast 20 mins.  Noted to be hyperkalemic after resuscitation. Recently moved here from [place].  Past Medical History  End-stage renal disease, atrial fibrillation, diabetes type 2, CVA with right hemiparesis, chronic subdural hematoma, gout, GERD, hyperlipidemia. Significant Hospital Events   10/19- Admit with PEA arrest, started CRRT 10/21 - family wishing to pursue more diagnostic testing.   Consults: date of consult/date signed off & final recs:  Nephrology 10/19/2017 >  Procedures (surgical and bedside):  ETT 10/19 >   Significant Diagnostic Tests:  CT head 10/19 > increasing size of subdural hematoma with local mass-effect.  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  Micro Data:  Bcx 10/19 Spcx 10/19 Urine culture 10/20 > neg  Antimicrobials:    Subjective:  Anemic overnight. CRRT off this morning.   Objective   Blood pressure 111/70, pulse 85, temperature (!) 97.5 F (36.4 C), temperature source Esophageal, resp. rate 19, height 5\' 7"  (1.702 m), weight 81.1 kg, SpO2 100 %.    Vent Mode: PRVC FiO2 (%):  [40 %-50 %] 50 % Set Rate:  [18 bmp] 18 bmp Vt Set:  [530 mL] 530 mL PEEP:  [5 cmH20] 5 cmH20 Plateau Pressure:  [15 cmH20-20 cmH20] 15 cmH20   Intake/Output Summary (Last 24 hours) at 11/07/2017 0904 Last data filed at 11/07/2017 0800 Gross per 24 hour  Intake 1902 ml  Output 1598 ml  Net 304 ml   Filed Weights   11/05/17 0500 11/06/17 0400 11/07/17 0500  Weight: 82.4 kg 83.2 kg 81.1 kg   Examination:  General:  Frail elderly male on vent Neuro:  Sedate, unarousable. Awaiting sedation wean.  HEENT:  [place]/AT, No JVD noted,  PERRL Cardiovascular:  RRR, no MRG Lungs:  Clear Abdomen:  Soft, non-distended, non-tender Musculoskeletal:  RLE edema.  Skin:  Intact, MMM  Resolved Hospital Problem list     Assessment & Plan:  67 year old with end-stage renal disease, cardiac arrest, hyperkalemia after missed dialysis  Cardiac arrest: PEA 30 mins. Felt to be secondary to hyperkalemia after missing HD. He remains encephalopathic secondary to this. Is now s/p 3 days CRRT and metabolic abnormalities have been resolved.  Hypothermia protocol deferred as subdural hemorrhage was bigger on presentation. Son reports a fall - Cardiac monitoring - Wean sedation today and hope to wake up - Neuro checks  Atrial fibrillation: rate has remained controlled.  - Holding flecainde  End-stage renal disease - CVVH off this AM, plan for IHD tomorrow and we will see how he tolerates.  - Nephrology following  Anemia of chronic/critical illness - transfusing 1 unit PRBC  Diabetes: has been hypoglycemic at times since admission - Hold outpatient medication - SSI coverage - Maintenance fluids with D5  Prior CVA, H/O SDH in [place] in July 19 - Seen by neurosurgery.  Not a surgical candidate.  Positive UA: Recently treated as outpt. There are no signs of active infection - Will follow cultures.  - Monitor off antibiotics. PCT up, low threshold to initiate.   RLE edema: Im told he is weak on R from prior CVA - doppler RLE   Disposition / Summary of Today's Plan 11/07/17   Start waking up today. Wean vent if  able.     Diet: TF Pain/Anxiety/Delirium protocol (if indicated):  DVT prophylaxis: SCDs. Holding lovenox due to SDH GI prophylaxis: Pepcid Hyperglycemia protocol: SSI coverage Mobility: Bed rest Code Status: Limited code Family Communication: Sons updated at bedside 10/21. None present this morning.   Labs   CBC: Recent Labs  Lab 11/08/2017 1308  11/09/2017 1616 11/05/17 0456 11/06/17 0429 11/07/17 0259  11/07/17 0403  WBC 25.2*  --   --  12.6* 14.9* 15.5* 15.5*  HGB 8.7*   < > 10.2* 8.4* 7.8* 6.6* 6.1*  HCT 28.4*   < > 30.0* 26.9* 26.3* 22.2* 21.2*  MCV 83.3  --   --  81.3 84.3 85.7 85.8  PLT 263  --   --  198 175 141* 143*   < > = values in this interval not displayed.    Basic Metabolic Panel: Recent Labs  Lab 11/06/2017 1308  11/05/17 0500 11/05/17 1601 11/06/17 0429 11/06/17 1600 11/07/17 0259  NA 141   < > 138 137 138 137 138  K 6.0*   < > 4.2 3.5 3.2* 3.8 3.7  CL 105   < > 101 102 104 105 107  CO2 23   < > 28 27 26 25 25   GLUCOSE 153*   < > 101* 98 72 95 120*  BUN 57*   < > 36* 24* 17 10 12   CREATININE 5.58*   < > 3.53* 2.60* 2.21* 1.78* 1.80*  CALCIUM 11.1*   < > 8.4* 8.2* 8.2* 7.9* 7.9*  MG 1.9  --  2.1  --  2.2  --  2.3  PHOS  --    < > 4.2 4.3 3.5  3.7 2.6 2.3*  2.3*   < > = values in this interval not displayed.   GFR: Estimated Creatinine Clearance: 41.2 mL/min (A) (by C-G formula based on SCr of 1.8 mg/dL (H)). Recent Labs  Lab 10/22/2017 1313 11/05/17 0456 11/05/17 1601 11/06/17 0429 11/07/17 0259 11/07/17 0403  PROCALCITON  --   --  20.47 23.32 12.45  --   WBC  --  12.6*  --  14.9* 15.5* 15.5*  LATICACIDVEN 5.25*  --   --   --   --   --     Liver Function Tests: Recent Labs  Lab 11/02/2017 1308  11/05/17 0500 11/05/17 1601 11/06/17 0429 11/06/17 1600 11/07/17 0259  AST 42*  --   --   --   --   --   --   ALT 13  --   --   --   --   --   --   ALKPHOS 97  --   --   --   --   --   --   BILITOT 0.4  --   --   --   --   --   --   PROT 5.4*  --   --   --   --   --   --   ALBUMIN 1.4*   < > 1.7* 1.8* 1.7* 1.9* 1.7*   < > = values in this interval not displayed.   No results for input(s): LIPASE, AMYLASE in the last 168 hours. No results for input(s): AMMONIA in the last 168 hours.  ABG    Component Value Date/Time   PHART 7.398 11/05/2017 0530   PCO2ART 46.9 11/05/2017 0530   PO2ART 103 11/05/2017 0530   HCO3 28.6 (H) 11/05/2017 0530   TCO2  30 10/22/2017 1616  O2SAT 97.4 11/05/2017 0530     Coagulation Profile: Recent Labs  Lab 11/05/2017 1308 10/23/2017 1556 10/21/2017 2012  INR 1.24 1.18 1.23    Cardiac Enzymes: Recent Labs  Lab 11/03/2017 1308 11/05/2017 1556 10/17/2017 2006 11/05/17 0209 11/05/17 0911  TROPONINI 0.08* 0.23* 0.21* 0.19* 0.13*    HbA1C: No results found for: HGBA1C  CBG: Recent Labs  Lab 11/06/17 1545 11/06/17 2012 11/06/17 2346 11/07/17 0356 11/07/17 0722  GLUCAP 86 84 100* 113* 146*   My critical care time excluding procedures: 30 minutes   Joneen Roach, AGACNP-BC [facility] Pulmonary/Critical Care Pager [phone] or [phone]  11/07/2017 9:04 AM

## 2017-11-07 NOTE — Progress Notes (Signed)
S: Had issues with CVVHD this morning reporting access issues, however HD cath flushes and draws well.  Off pressors overnight and CVVHD was held this am around 7:30am O:BP 98/60   Pulse 81   Temp (!) 96.8 F (36 C) (Esophageal)   Resp 19   Ht '5\' 7"'$  (1.702 m)   Wt 81.1 kg   SpO2 100%   BMI 28.00 kg/m   Intake/Output Summary (Last 24 hours) at 11/07/2017 1045 Last data filed at 11/07/2017 0900 Gross per 24 hour  Intake 1856.43 ml  Output 1532 ml  Net 324.43 ml   Intake/Output: I/O last 3 completed shifts: In: 2505.8 [I.V.:1525.8; Other:25; NG/GT:905; IV Piggyback:50] Out: 2250 [Other:2250]  Intake/Output this shift:  Total I/O In: 109.3 [I.V.:49.3; NG/GT:60] Out: -  Weight change: -2.1 kg Gen: intubated minimally responsive CVS: no rub Resp: decreased BS at bases Abd: benign Ext: 3+ edema right lower ext, 1+ on left  Recent Labs  Lab 10/19/2017 1308  10/17/2017 1556 10/29/2017 1616 11/05/17 0500 11/05/17 1601 11/06/17 0429 11/06/17 1600 11/07/17 0259  NA 141   < > 141 140 138 137 138 137 138  K 6.0*   < > 5.5* 5.5* 4.2 3.5 3.2* 3.8 3.7  CL 105   < > 103 100 101 102 104 105 107  CO2 23  --  28  --  '28 27 26 25 25  '$ GLUCOSE 153*   < > 133* 130* 101* 98 72 95 120*  BUN 57*   < > 63* 57* 36* 24* '17 10 12  '$ CREATININE 5.58*   < > 5.94* 6.40* 3.53* 2.60* 2.21* 1.78* 1.80*  ALBUMIN 1.4*  --  1.7*  --  1.7* 1.8* 1.7* 1.9* 1.7*  CALCIUM 11.1*  --  10.2  --  8.4* 8.2* 8.2* 7.9* 7.9*  PHOS  --   --  8.6*  --  4.2 4.3 3.5  3.7 2.6 2.3*  2.3*  AST 42*  --   --   --   --   --   --   --   --   ALT 13  --   --   --   --   --   --   --   --    < > = values in this interval not displayed.   Liver Function Tests: Recent Labs  Lab 10/19/2017 1308  11/06/17 0429 11/06/17 1600 11/07/17 0259  AST 42*  --   --   --   --   ALT 13  --   --   --   --   ALKPHOS 97  --   --   --   --   BILITOT 0.4  --   --   --   --   PROT 5.4*  --   --   --   --   ALBUMIN 1.4*   < > 1.7* 1.9* 1.7*   < > = values in this interval not displayed.   No results for input(s): LIPASE, AMYLASE in the last 168 hours. No results for input(s): AMMONIA in the last 168 hours. CBC: Recent Labs  Lab 11/15/2017 1308  11/05/17 0456 11/06/17 0429 11/07/17 0259 11/07/17 0403  WBC 25.2*  --  12.6* 14.9* 15.5* 15.5*  HGB 8.7*   < > 8.4* 7.8* 6.6* 6.1*  HCT 28.4*   < > 26.9* 26.3* 22.2* 21.2*  MCV 83.3  --  81.3 84.3 85.7 85.8  PLT 263  --  198 175  141* 143*   < > = values in this interval not displayed.   Cardiac Enzymes: Recent Labs  Lab 10/28/2017 1308 11/07/2017 1556 11/13/2017 2006 11/05/17 0209 11/05/17 0911  TROPONINI 0.08* 0.23* 0.21* 0.19* 0.13*   CBG: Recent Labs  Lab 11/06/17 1545 11/06/17 2012 11/06/17 2346 11/07/17 0356 11/07/17 0722  GLUCAP 86 84 100* 113* 146*    Iron Studies: No results for input(s): IRON, TIBC, TRANSFERRIN, FERRITIN in the last 72 hours. Studies/Results: Dg Chest Port 1 View  Result Date: 11/07/2017 CLINICAL DATA:  Endotracheal tube. EXAM: PORTABLE CHEST 1 VIEW COMPARISON:  Radiograph of November 06, 2017. FINDINGS: Stable cardiomediastinal silhouette. Endotracheal and nasogastric tubes are unchanged in position. Right subclavian left internal jugular catheter is unchanged in position. No pneumothorax is noted. Mild bibasilar subsegmental atelectasis is noted. No significant pleural effusion is noted. Bony thorax is unremarkable. IMPRESSION: Stable support apparatus.  Mild bibasilar subsegmental atelectasis. Electronically Signed   By: Marijo Conception, M.D.   On: 11/07/2017 10:06   Dg Chest Port 1 View  Result Date: 11/06/2017 CLINICAL DATA:  Acute respiratory failure. EXAM: PORTABLE CHEST 1 VIEW COMPARISON:  11/16/2017 FINDINGS: Endotracheal tube is 3.2 cm above the carina. Nasogastric tube extends to the abdomen. Right jugular dialysis catheter tip is in the lower SVC and stable. Again noted are low lung volumes with densities or atelectasis at the medial  right lung base. Heart size is upper limits of normal but unchanged. Left jugular central line at the junction of the left innominate vein and SVC and stable. Negative for a pneumothorax. IMPRESSION: Low lung volumes. Few densities at the right lung base are most compatible with atelectasis. Stable support apparatuses as described. Electronically Signed   By: Markus Daft M.D.   On: 11/06/2017 08:02   . sodium chloride   Intravenous Once  . chlorhexidine gluconate (MEDLINE KIT)  15 mL Mouth Rinse BID  . Chlorhexidine Gluconate Cloth  6 each Topical Daily  . Chlorhexidine Gluconate Cloth  6 each Topical Q0600  . feeding supplement (PRO-STAT SUGAR FREE 64)  30 mL Per Tube TID  . feeding supplement (VITAL HIGH PROTEIN)  1,000 mL Per Tube Q24H  . fentaNYL (SUBLIMAZE) injection  50 mcg Intravenous Once  . insulin aspart  0-9 Units Subcutaneous Q4H  . mouth rinse  15 mL Mouth Rinse 10 times per day  . mupirocin ointment  1 application Nasal BID    BMET    Component Value Date/Time   NA 138 11/07/2017 0259   K 3.7 11/07/2017 0259   CL 107 11/07/2017 0259   CO2 25 11/07/2017 0259   GLUCOSE 120 (H) 11/07/2017 0259   BUN 12 11/07/2017 0259   CREATININE 1.80 (H) 11/07/2017 0259   CALCIUM 7.9 (L) 11/07/2017 0259   GFRNONAA 38 (L) 11/07/2017 0259   GFRAA 44 (L) 11/07/2017 0259   CBC    Component Value Date/Time   WBC 15.5 (H) 11/07/2017 0403   RBC 2.47 (L) 11/07/2017 0403   HGB 6.1 (LL) 11/07/2017 0403   HCT 21.2 (L) 11/07/2017 0403   PLT 143 (L) 11/07/2017 0403   MCV 85.8 11/07/2017 0403   MCH 24.7 (L) 11/07/2017 0403   MCHC 28.8 (L) 11/07/2017 0403   RDW 16.2 (H) 11/07/2017 0403   LYMPHSABS 1.5 10/29/2017 1110   MONOABS 1.3 (H) 10/29/2017 1110   EOSABS 0.3 10/29/2017 1110   BASOSABS 0.0 10/29/2017 1110    Dialysis: TTS at [facility], just started there 10/28/17 after moving  from [place], [place] 4hr, 400/800, EDW 82.5kg, 2K/2.25Ca, TDC, heparin 2000 bolus - Hectoral 52mg IV  q HD - Venofer '50mg'$  IV weekly - Mircera 1028m IV q 2 weeks (has not been given yet)    Assessment/ Plan:   1. Cardiac arrest- prolonged down time with poor neurologic recovery but starting to respond somewhat 2. Subdural hematoma- small increase but no surgical intervention per Neurosurgery. 3. Embolic L MMA CVA 4. ESRD off CVVHD this morning.  Will attempt IHD tomorrow, however if he cannot tolerate it would not recommend restarting CVVHD and re-evaluate goals of care with family. 5. 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 Nutrition: renal  8. Hypertension: low bp and off meds 9. Right leg edema- concerning for DVT, however can't anticoagulate due to SDH.  Duplex and IVC filter possible if family wants to be aggressive.  Will defer to primary svc.  10. Disposition- poor overall prognosis.  Await family meeting to help set goals/limits of care and possible transition to terminal wean.  JoDonetta PottsMD [phone]

## 2017-11-08 ENCOUNTER — Inpatient Hospital Stay (HOSPITAL_COMMUNITY): Payer: Medicare Other

## 2017-11-08 ENCOUNTER — Other Ambulatory Visit: Payer: Self-pay

## 2017-11-08 DIAGNOSIS — G931 Anoxic brain damage, not elsewhere classified: Secondary | ICD-10-CM

## 2017-11-08 LAB — RENAL FUNCTION PANEL
ALBUMIN: 1.5 g/dL — AB (ref 3.5–5.0)
ALBUMIN: 1.9 g/dL — AB (ref 3.5–5.0)
ANION GAP: 7 (ref 5–15)
ANION GAP: 7 (ref 5–15)
BUN: 31 mg/dL — ABNORMAL HIGH (ref 8–23)
BUN: 19 mg/dL (ref 8–23)
CALCIUM: 8.1 mg/dL — AB (ref 8.9–10.3)
CO2: 25 mmol/L (ref 22–32)
CO2: 27 mmol/L (ref 22–32)
Calcium: 8 mg/dL — ABNORMAL LOW (ref 8.9–10.3)
Chloride: 108 mmol/L (ref 98–111)
Chloride: 105 mmol/L (ref 98–111)
Creatinine, Ser: 2.75 mg/dL — ABNORMAL HIGH (ref 0.61–1.24)
Creatinine, Ser: 1.63 mg/dL — ABNORMAL HIGH (ref 0.61–1.24)
GFR calc Af Amer: 26 mL/min — ABNORMAL LOW (ref >60)
GFR calc Af Amer: 49 mL/min — ABNORMAL LOW (ref >60)
GFR calc non Af Amer: 22 mL/min — ABNORMAL LOW (ref >60)
GFR, EST NON AFRICAN AMERICAN: 42 mL/min — AB (ref >60)
GLUCOSE: 148 mg/dL — AB (ref 70–99)
GLUCOSE: 170 mg/dL — AB (ref 70–99)
PHOSPHORUS: 1.2 mg/dL — AB (ref 2.5–4.6)
POTASSIUM: 3.1 mmol/L — AB (ref 3.5–5.1)
Potassium: 3.4 mmol/L — ABNORMAL LOW (ref 3.5–5.1)
SODIUM: 139 mmol/L (ref 135–145)
Sodium: 140 mmol/L (ref 135–145)

## 2017-11-08 LAB — HEMOGLOBIN AND HEMATOCRIT, BLOOD
HCT: 30.3 % — ABNORMAL LOW (ref 39.0–52.0)
Hemoglobin: 9.3 g/dL — ABNORMAL LOW (ref 13.0–17.0)

## 2017-11-08 LAB — GLUCOSE, CAPILLARY
GLUCOSE-CAPILLARY: 146 mg/dL — AB (ref 70–99)
GLUCOSE-CAPILLARY: 136 mg/dL — AB (ref 70–99)
GLUCOSE-CAPILLARY: 145 mg/dL — AB (ref 70–99)
Glucose-Capillary: 117 mg/dL — ABNORMAL HIGH (ref 70–99)
Glucose-Capillary: 134 mg/dL — ABNORMAL HIGH (ref 70–99)
Glucose-Capillary: 155 mg/dL — ABNORMAL HIGH (ref 70–99)
Glucose-Capillary: 160 mg/dL — ABNORMAL HIGH (ref 70–99)

## 2017-11-08 LAB — CBC
HCT: 22.3 % — ABNORMAL LOW (ref 39.0–52.0)
HEMOGLOBIN: 6.6 g/dL — AB (ref 13.0–17.0)
MCH: 24.9 pg — ABNORMAL LOW (ref 26.0–34.0)
MCHC: 29.6 g/dL — AB (ref 30.0–36.0)
MCV: 84.2 fL (ref 80.0–100.0)
NRBC: 0 % (ref 0.0–0.2)
Platelets: 169 10*3/uL (ref 150–400)
RBC: 2.65 MIL/uL — AB (ref 4.22–5.81)
RDW: 16 % — ABNORMAL HIGH (ref 11.5–15.5)
WBC: 17.2 10*3/uL — AB (ref 4.0–10.5)

## 2017-11-08 LAB — PREPARE RBC (CROSSMATCH)

## 2017-11-08 LAB — MAGNESIUM: Magnesium: 2.2 mg/dL (ref 1.7–2.4)

## 2017-11-08 MED ORDER — VANCOMYCIN HCL IN DEXTROSE 1-5 GM/200ML-% IV SOLN: 1000.0000 mg | Freq: Once | INTRAVENOUS | Status: DC

## 2017-11-08 MED ORDER — HEPARIN SODIUM (PORCINE) 1000 UNIT/ML IJ SOLN
3.3000 mL | Freq: Once | INTRAMUSCULAR | Status: AC
  Administered 2017-11-08: 3300 [IU] via INTRAVENOUS
  Filled 2017-11-08: qty 4

## 2017-11-08 MED ORDER — VANCOMYCIN HCL 10 G IV SOLR
2000.0000 mg | Freq: Once | INTRAVENOUS | Status: AC
  Administered 2017-11-08: 2000 mg via INTRAVENOUS
  Filled 2017-11-08: qty 2000

## 2017-11-08 MED ORDER — VANCOMYCIN HCL IN DEXTROSE 1-5 GM/200ML-% IV SOLN
1000.0000 mg | Freq: Once | INTRAVENOUS | Status: AC
  Administered 2017-11-08: 1000 mg via INTRAVENOUS
  Filled 2017-11-08: qty 200

## 2017-11-08 MED ORDER — ALTEPLASE 2 MG IJ SOLR: 2.0000 mg | Freq: Once | INTRAMUSCULAR | Status: DC | PRN

## 2017-11-08 MED ORDER — PENTAFLUOROPROP-TETRAFLUOROETH EX AERO: 1.0000 "application " | INHALATION_SPRAY | CUTANEOUS | Status: DC | PRN

## 2017-11-08 MED ORDER — SODIUM CHLORIDE 0.9 % IV SOLN: 100.0000 mL | INTRAVENOUS | Status: DC | PRN

## 2017-11-08 MED ORDER — LIDOCAINE HCL (PF) 1 % IJ SOLN: 5.0000 mL | INTRAMUSCULAR | Status: DC | PRN

## 2017-11-08 MED ORDER — SODIUM CHLORIDE 0.9% IV SOLUTION: Freq: Once | INTRAVENOUS | Status: DC

## 2017-11-08 MED ORDER — LIDOCAINE-PRILOCAINE 2.5-2.5 % EX CREA
1.0000 "application " | TOPICAL_CREAM | CUTANEOUS | Status: DC | PRN
  Filled 2017-11-08: qty 5

## 2017-11-08 NOTE — Progress Notes (Signed)
Chart reviewed for LOS; B Atzin Buchta RN,MHA,BSN [phone] 

## 2017-11-08 NOTE — Progress Notes (Signed)
NAME:  Guy Fry, MRN:  [id number], DOB:  [birth year], LOS: 4 ADMISSION DATE:  10/23/2017, CONSULTATION DATE:  10/26/2017 REFERRING MD:  EDP, CHIEF COMPLAINT:  Cardiac arrest  Brief History   67 year old with end-stage renal disease.  Admitted for asystolic cardiac arrest after missed dialysis Total downtime approximately atleast 20 mins.  Noted to be hyperkalemic after resuscitation. Recently moved here from [place].  Past Medical History  End-stage renal disease, atrial fibrillation, diabetes type 2, CVA with right hemiparesis, chronic subdural hematoma, gout, GERD, hyperlipidemia. Significant Hospital Events   10/19- Admit with PEA arrest, started CRRT. CT head 10/19 > increasing size of subdural hematoma with local mass-effect.  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 10/21 - family wishing to pursue more diagnostic testing.  10/22 - Limited code. Sons not interested in LTAC/Trach. Time limited Rx for next few days DVT - RLE, IR consulted for IVC filter. CRRT changed to iHD  Consults: date of consult/date signed off & final recs:  Nephrology 10/26/2017 >  Procedures (surgical and bedside):  ETT 10/19 >   Significant Diagnostic Tests:  CT head 10/19 > increasing size of subdural hematoma with local mass-effect.  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  Micro Data:  Bcx 10/19 Spcx 10/19 Urine culture 10/20 > neg  Antimicrobials:    SUBJECTIVE/OVERNIGHT/INTERVAL HX   11/08/17  - not on sedation. On iHD starting today. Not following commands - withdraws to mouth care or movement. Not on vasopressors. Low grade temp 99.66F. S/p PRBC yesterday but hgb flat 6's . No religious barrier to prbc per RN    Objective   Blood pressure (!) 141/70, pulse 84, temperature 99.2 F (37.3 C), temperature source Axillary, resp. rate 12, height 5\' 7"  (1.702 m), weight 82.8 kg, SpO2 100 %.    Vent Mode: PRVC FiO2 (%):  [50 %] 50 % Set Rate:  [18 bmp] 18 bmp Vt Set:   [530 mL] 530 mL PEEP:  [5 cmH20] 5 cmH20 Plateau Pressure:  [15 cmH20-20 cmH20] 18 cmH20   Intake/Output Summary (Last 24 hours) at 11/08/2017 0840 Last data filed at 11/07/2017 2000 Gross per 24 hour  Intake 1221.49 ml  Output -  Net 1221.49 ml   Filed Weights   11/06/17 0400 11/07/17 0500 11/08/17 0500  Weight: 83.2 kg 81.1 kg 82.8 kg   General Appearance:  Looks criticall ill OBESE - yes Head:  Normocephalic, without obvious abnormality, atraumatic Eyes:  PERRL - yes, conjunctiva/corneas - muddy     Ears:  Normal external ear canals, both ears Nose:  G tube - no Throat:  ETT TUBE - yes , OG tube - y Neck:  Supple,  No enlargement/tenderness/nodules Lungs: Clear to auscultation bilaterally, Ventilator   Synchrony - yes Heart:  S1 and S2 normal, no murmur, CVP - no.  Pressors - no Abdomen:  Soft, no masses, no organomegaly Genitalia / Rectal:  Not done Extremities:  Extremities- intact Skin:  ntact in exposed areas . Sacral area - not examined Neurologic:  Sedation - none -> RASS - -2 . Moves all 4s - yesa. CAM-ICU - unable to assess . Orientation - not oriented      LABS    PULMONARY Recent Labs  Lab 10/29/2017 1312 11/01/2017 1426 11/02/2017 1616 11/05/17 0530  PHART  --  7.355  --  7.398  PCO2ART  --  52.9*  --  46.9  PO2ART  --  128.0*  --  103  HCO3  --  29.4*  --  28.6*  TCO2 28 31 30   --   O2SAT  --  99.0  --  97.4    CBC Recent Labs  Lab 11/07/17 0259 11/07/17 0403 11/08/17 0441  HGB 6.6* 6.1* 6.6*  HCT 22.2* 21.2* 22.3*  WBC 15.5* 15.5* 17.2*  PLT 141* 143* 169    COAGULATION Recent Labs  Lab Nov 15, 2017 1308 11/15/17 1556 11-15-2017 2012  INR 1.24 1.18 1.23    CARDIAC   Recent Labs  Lab 2017/11/15 1308 11-15-2017 1556 2017-11-15 2006 11/05/17 0209 11/05/17 0911  TROPONINI 0.08* 0.23* 0.21* 0.19* 0.13*   No results for input(s): PROBNP in the last 168 hours.   CHEMISTRY Recent Labs  Lab 11-15-2017 1308  11/05/17 0500  11/06/17 0429  11/06/17 1600 11/07/17 0259 11/07/17 1645 11/08/17 0441  NA 141   < > 138   < > 138 137 138 139 140  K 6.0*   < > 4.2   < > 3.2* 3.8 3.7 3.0* 3.1*  CL 105   < > 101   < > 104 105 107 108 108  CO2 23   < > 28   < > 26 25 25 26 25   GLUCOSE 153*   < > 101*   < > 72 95 120* 133* 148*  BUN 57*   < > 36*   < > 17 10 12 20  31*  CREATININE 5.58*   < > 3.53*   < > 2.21* 1.78* 1.80* 2.23* 2.75*  CALCIUM 11.1*   < > 8.4*   < > 8.2* 7.9* 7.9* 7.9* 8.0*  MG 1.9  --  2.1  --  2.2  --  2.3  --   --   PHOS  --    < > 4.2   < > 3.5  3.7 2.6 2.3*  2.3* 1.3* 1.2*   < > = values in this interval not displayed.   Estimated Creatinine Clearance: 27.2 mL/min (A) (by C-G formula based on SCr of 2.75 mg/dL (H)).   LIVER Recent Labs  Lab November 15, 2017 1308 11/15/17 1556 2017/11/15 2012  11/06/17 0429 11/06/17 1600 11/07/17 0259 11/07/17 1645 11/08/17 0441  AST 42*  --   --   --   --   --   --   --   --   ALT 13  --   --   --   --   --   --   --   --   ALKPHOS 97  --   --   --   --   --   --   --   --   BILITOT 0.4  --   --   --   --   --   --   --   --   PROT 5.4*  --   --   --   --   --   --   --   --   ALBUMIN 1.4* 1.7*  --    < > 1.7* 1.9* 1.7* 1.6* 1.5*  INR 1.24 1.18 1.23  --   --   --   --   --   --    < > = values in this interval not displayed.     INFECTIOUS Recent Labs  Lab 11-15-17 1313 11/05/17 1601 11/06/17 0429 11/07/17 0259  LATICACIDVEN 5.25*  --   --   --   PROCALCITON  --  20.47 23.32 12.45     ENDOCRINE CBG (last 3)  Recent  Labs    11/07/17 2034 11/08/17 0016 11/08/17 0348  GLUCAP 146* 134* 160*         IMAGING x48h  - image(s) personally visualized  -   highlighted in bold Dg Chest Port 1 View  Result Date: 11/07/2017 CLINICAL DATA:  Endotracheal tube. EXAM: PORTABLE CHEST 1 VIEW COMPARISON:  Radiograph of November 06, 2017. FINDINGS: Stable cardiomediastinal silhouette. Endotracheal and nasogastric tubes are unchanged in position. Right subclavian left  internal jugular catheter is unchanged in position. No pneumothorax is noted. Mild bibasilar subsegmental atelectasis is noted. No significant pleural effusion is noted. Bony thorax is unremarkable. IMPRESSION: Stable support apparatus.  Mild bibasilar subsegmental atelectasis. Electronically Signed   By: Lupita Raider, M.D.   On: 11/07/2017 10:06     Recent Results (from the past 240 hour(s))  MRSA PCR Screening     Status: None   Collection Time: 10/29/17  9:19 PM  Result Value Ref Range Status   MRSA by PCR NEGATIVE NEGATIVE Final    Comment:        The GeneXpert MRSA Assay (FDA approved for NASAL specimens only), is one component of a comprehensive MRSA colonization surveillance program. It is not intended to diagnose MRSA infection nor to guide or monitor treatment for MRSA infections. Performed at [facility], [address], [place], [place] [address]   Culture, blood (Routine X 2) w Reflex to ID Panel     Status: None (Preliminary result)   Collection Time: 10/20/2017  4:29 PM  Result Value Ref Range Status   Specimen Description BLOOD RIGHT HAND  Final   Special Requests   Final    BOTTLES DRAWN AEROBIC AND ANAEROBIC Blood Culture adequate volume   Culture   Final    NO GROWTH 4 DAYS Performed at [facility], [address] [place], [place] [address]    Report Status PENDING  Incomplete  Culture, blood (Routine X 2) w Reflex to ID Panel     Status: None (Preliminary result)   Collection Time: 10/24/2017  4:49 PM  Result Value Ref Range Status   Specimen Description BLOOD SITE NOT SPECIFIED  Final   Special Requests   Final    BOTTLES DRAWN AEROBIC AND ANAEROBIC Blood Culture adequate volume   Culture   Final    NO GROWTH 4 DAYS Performed at [facility], [address] [place], [place] [address]    Report Status PENDING  Incomplete  MRSA PCR Screening     Status: Abnormal   Collection Time: 11/12/2017  4:50 PM  Result Value Ref Range Status    MRSA by PCR POSITIVE (A) NEGATIVE Final    Comment:        The GeneXpert MRSA Assay (FDA approved for NASAL specimens only), is one component of a comprehensive MRSA colonization surveillance program. It is not intended to diagnose MRSA infection nor to guide or monitor treatment for MRSA infections. RBV J MELFORD RN 18:49 11/15/2017 gf   Culture, Urine     Status: None   Collection Time: 11/05/17 11:45 AM  Result Value Ref Range Status   Specimen Description URINE, CATHETERIZED  Final   Special Requests NONE  Final   Culture   Final    NO GROWTH Performed at [facility], [address] [place], [place] [address]    Report Status 11/06/2017 FINAL  Final  Culture, respiratory (non-expectorated)     Status: None   Collection Time:  11/05/17 12:24 PM  Result Value Ref Range Status   Specimen Description TRACHEAL ASPIRATE  Final   Special Requests NONE  Final   Gram Stain   Final    FEW WBC PRESENT, PREDOMINANTLY MONONUCLEAR FEW GRAM POSITIVE COCCI IN CLUSTERS RARE GRAM VARIABLE ROD Performed at [facility], [address], [place], [place] [address]    Culture FEW METHICILLIN RESISTANT STAPHYLOCOCCUS AUREUS  Final   Report Status 11/07/2017 FINAL  Final   Organism ID, Bacteria METHICILLIN RESISTANT STAPHYLOCOCCUS AUREUS  Final      Susceptibility   Methicillin resistant staphylococcus aureus - MIC*    CIPROFLOXACIN >=8 RESISTANT Resistant     ERYTHROMYCIN >=8 RESISTANT Resistant     GENTAMICIN <=0.5 SENSITIVE Sensitive     OXACILLIN >=4 RESISTANT Resistant     TETRACYCLINE 2 SENSITIVE Sensitive     VANCOMYCIN 1 SENSITIVE Sensitive     TRIMETH/SULFA <=10 SENSITIVE Sensitive     CLINDAMYCIN >=8 RESISTANT Resistant     RIFAMPIN <=0.5 SENSITIVE Sensitive     Inducible Clindamycin NEGATIVE Sensitive     * FEW METHICILLIN RESISTANT STAPHYLOCOCCUS AUREUS     Resolved Hospital Problem list     Assessment & Plan:  67 year old with end-stage renal disease,  cardiac arrest, hyperkalemia after missed dialysis  Cardiac arrest: PEA 30 mins. Felt to be secondary to hyperkalemia after missing HD. He remains encephalopathic secondary to this. Is now s/p 3 days CRRT and metabolic abnormalities have been resolved.  Hypothermia protocol deferred as subdural hemorrhage was bigger on presentation. Son reports a fall  11/08/2017 - mantains bp/HR  Plan   - Supportive caer with MAP > 65  Atrial fibrillation:   - 11/08/2017 - HR 91 and sinus - monitor off flecainaide  End-stage renal disease -  Off CRRT since 11/07/17 - iHD from 11/08/2017 per renal  Anemia of chronic/critical illness - - s/p  1 unit PRBC 11/07/17 - 11/08/2017 - hgb unchanged. Will repeat 1 unit prbc  Diabetes: has been hypoglycemic at times since admission - Hold outpatient medication - SSI coverage - Maintenance fluids with D5  Prior CVA, H/O SDH in [place] in July 19 - Seen by neurosurgery.  Not a surgical candidate.  Possible sepsis/MRSA pneumonia - ? VAP/HCAP - start empiric Vanc  RLE edema: Im told he is weak on R from prior CVA - RLE DVT + on 11/07/17  - no anticoagulation - await IVC filter    Disposition / Summary of Today's Plan 11/08/17   supporttive care    Diet: TF Pain/Anxiety/Delirium protocol (if indicated):  DVT prophylaxis: SCDs. Holding lovenox due to SDH GI prophylaxis: Pepcid Hyperglycemia protocol: SSI coverage Mobility: Bed rest Code Status: Limited code Family Communication: Sons updated at bedside 10/21.  None present 11/08/2017  ATTESTATION & SIGNATURE   The patient is critically ill with multiple organ systems failure and requires high complexity decision making for assessment and support, frequent evaluation and titration of therapies, application of advanced monitoring technologies and extensive interpretation of multiple databases.   Critical Care Time devoted to patient care services described in this note is  30  Minutes. This time  reflects time of care of this signee Dr Kalman Shan. This critical care time does not reflect procedure time, or teaching time or supervisory time of PA/NP/Med student/Med Resident etc but could involve care discussion time     Dr. Kalman Shan, M.D., [facility].C.P Pulmonary and Critical Care Medicine Staff Physician [facility] [place] [facility] and  Critical Care Pager: [phone], If no answer or between  15:00h - 7:00h: call [phone]  11/08/2017 8:40 AM

## 2017-11-08 NOTE — Progress Notes (Addendum)
D/w sons and sister at bedside .LLOs 4 -   - discussed likely LTAC/Trach need. They do not seem inclined. They want to see wakefulness with iHD x 1 and talk to neuro and depending on these 2 make a call towards terminal wean. They seem inclined towards that.. D/w Neuro -and probably best evalauted 11/09/17 after HD 11/08/2017   - They refused IVC filter for RLE DVT- > want to see his wakefulness afte iHD     SIGNATURE    Dr. Kalman Shan, M.D., F.C.C.P,  Pulmonary and Critical Care Medicine Staff Physician, [facility] Director - Interstitial Lung Disease  Program  Pulmonary Fibrosis [facility] at [facility] [place], [place], [address]  Pager: [phone], If no answer or between  15:00h - 7:00h: call [phone] Telephone: [phone]  1:20 PM 11/08/2017

## 2017-11-08 NOTE — Progress Notes (Signed)
S:Off pressors and CVVHD since yesterday.  Duplex scan + for DVT  O:BP (!) 156/76   Pulse 89   Temp 99.2 F (37.3 C) (Axillary)   Resp 18   Ht _0  (1.702 m)   Wt 82.8 kg   SpO2 100%   BMI 28.59 kg/m   Intake/Output Summary (Last 24 hours) at 11/08/2017 4827 Last data filed at 11/07/2017 2000 Gross per 24 hour  Intake 1202.18 ml  Output -  Net 1202.18 ml   Intake/Output: I/O last 3 completed shifts: In: 2364.6 [I.V.:466.7; Blood:297.9; NG/GT:1550; IV Piggyback:50] Out: 993 [Other:993]  Intake/Output this shift:  No intake/output data recorded. Weight change: 1.7 kg Gen: intubated unresponsive CVS: no rub Resp: occ rhonchi Abd: +BS, soft NT Ext:3+ edema on RLE, 1+ on left  Recent Labs  Lab 10/27/2017 1308  11/05/17 0500 11/05/17 1601 11/06/17 0429 11/06/17 1600 11/07/17 0259 11/07/17 1645 11/08/17 0441  NA 141   < > 138 137 138 137 138 139 140  K 6.0*   < > 4.2 3.5 3.2* 3.8 3.7 3.0* 3.1*  CL 105   < > 101 102 104 105 107 108 108  CO2 23   < > _1 GLUCOSE 153*   < > 101* 98 72 95 120* 133* 148*  BUN 57*   < > 36* 24* _2 31*  CREATININE 5.58*   < > 3.53* 2.60* 2.21* 1.78* 1.80* 2.23* 2.75*  ALBUMIN 1.4*   < > 1.7* 1.8* 1.7* 1.9* 1.7* 1.6* 1.5*  CALCIUM 11.1*   < > 8.4* 8.2* 8.2* 7.9* 7.9* 7.9* 8.0*  PHOS  --    < > 4.2 4.3 3.5  3.7 2.6 2.3*  2.3* 1.3* 1.2*  AST 42*  --   --   --   --   --   --   --   --   ALT 13  --   --   --   --   --   --   --   --    < > = values in this interval not displayed.   Liver Function Tests: Recent Labs  Lab 11/03/2017 1308  11/07/17 0259 11/07/17 1645 11/08/17 0441  AST 42*  --   --   --   --   ALT 13  --   --   --   --   ALKPHOS 97  --   --   --   --   BILITOT 0.4  --   --   --   --   PROT 5.4*  --   --   --   --   ALBUMIN 1.4*   < > 1.7* 1.6* 1.5*   < > = values in this interval not displayed.   No results for input(s): LIPASE, AMYLASE in the last 168 hours. No results for input(s): AMMONIA  in the last 168 hours. CBC: Recent Labs  Lab 11/05/17 0456 11/06/17 0429 11/07/17 0259 11/07/17 0403 11/08/17 0441  WBC 12.6* 14.9* 15.5* 15.5* 17.2*  HGB 8.4* 7.8* 6.6* 6.1* 6.6*  HCT 26.9* 26.3* 22.2* 21.2* 22.3*  MCV 81.3 84.3 85.7 85.8 84.2  PLT 198 175 141* 143* 169   Cardiac Enzymes: Recent Labs  Lab 11/01/2017 1308 10/17/2017 1556 10/25/2017 2006 11/05/17 0209 11/05/17 0911  TROPONINI 0.08* 0.23* 0.21* 0.19* 0.13*   CBG: Recent Labs  Lab 11/07/17 1647 11/07/17 2034 11/08/17 0016 11/08/17 0348 11/08/17 0786  GLUCAP  125* 146* 134* 160* 136*    Iron Studies: No results for input(s): IRON, TIBC, TRANSFERRIN, FERRITIN in the last 72 hours. Studies/Results: Dg Chest Port 1 View  Result Date: 11/07/2017 CLINICAL DATA:  Endotracheal tube. EXAM: PORTABLE CHEST 1 VIEW COMPARISON:  Radiograph of November 06, 2017. FINDINGS: Stable cardiomediastinal silhouette. Endotracheal and nasogastric tubes are unchanged in position. Right subclavian left internal jugular catheter is unchanged in position. No pneumothorax is noted. Mild bibasilar subsegmental atelectasis is noted. No significant pleural effusion is noted. Bony thorax is unremarkable. IMPRESSION: Stable support apparatus.  Mild bibasilar subsegmental atelectasis. Electronically Signed   By: Marijo Conception, M.D.   On: 11/07/2017 10:06   . sodium chloride   Intravenous Once  . sodium chloride   Intravenous Once  . chlorhexidine gluconate (MEDLINE KIT)  15 mL Mouth Rinse BID  . Chlorhexidine Gluconate Cloth  6 each Topical Daily  . Chlorhexidine Gluconate Cloth  6 each Topical Q0600  . feeding supplement (PRO-STAT SUGAR FREE 64)  30 mL Per Tube TID  . feeding supplement (VITAL HIGH PROTEIN)  1,000 mL Per Tube Q24H  . fentaNYL (SUBLIMAZE) injection  50 mcg Intravenous Once  . insulin aspart  0-9 Units Subcutaneous Q4H  . mouth rinse  15 mL Mouth Rinse 10 times per day  . mupirocin ointment  1 application Nasal BID     BMET    Component Value Date/Time   NA 140 11/08/2017 0441   K 3.1 (L) 11/08/2017 0441   CL 108 11/08/2017 0441   CO2 25 11/08/2017 0441   GLUCOSE 148 (H) 11/08/2017 0441   BUN 31 (H) 11/08/2017 0441   CREATININE 2.75 (H) 11/08/2017 0441   CALCIUM 8.0 (L) 11/08/2017 0441   GFRNONAA 22 (L) 11/08/2017 0441   GFRAA 26 (L) 11/08/2017 0441   CBC    Component Value Date/Time   WBC 17.2 (H) 11/08/2017 0441   RBC 2.65 (L) 11/08/2017 0441   HGB 6.6 (LL) 11/08/2017 0441   HCT 22.3 (L) 11/08/2017 0441   PLT 169 11/08/2017 0441   MCV 84.2 11/08/2017 0441   MCH 24.9 (L) 11/08/2017 0441   MCHC 29.6 (L) 11/08/2017 0441   RDW 16.0 (H) 11/08/2017 0441   LYMPHSABS 1.5 10/29/2017 1110   MONOABS 1.3 (H) 10/29/2017 1110   EOSABS 0.3 10/29/2017 1110   BASOSABS 0.0 10/29/2017 1110    Dialysis: TTS at [facility], just started there 10/28/17 after moving from [place], [place] 4hr, 400/800, EDW 82.5kg, 2K/2.25Ca, TDC, heparin 2000 bolus - Hectoral 47mg IV q HD - Venofer 512mIV weekly - Mircera 10040mIV q 2 weeks (has not been given yet)    Assessment/ Plan:  1. Cardiac arrest- prolonged down time with poor neurologic recovery but starting to respond somewhat 2. Subdural hematoma- small increase but no surgical intervention per Neurosurgery. 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  Will attempt IHD today, however if he cannot tolerate it would not recommend restarting CVVHD and re-evaluate goals of care with family. 5. 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 Nutrition:renal 9. Hypertension:low bp and off meds 10. Right leg DVT, however can't anticoagulate due to SDH.  Duplex + yesterday and IR consulted for IVC filter placement. 11. Disposition- poor overall prognosis. Await family meeting to help set goals/limits of care and possible transition to terminal wean.   JosDonetta PottsMD [phone]

## 2017-11-08 NOTE — Progress Notes (Signed)
Pharmacy Antibiotic Note  Guy Fry is a 67 y.o. male admitted on December 02, 2017 with cardiac arrest found to have a SDH on head CT. Respiratory culture positive for MRSA. Pharmacy has been consulted for vancomycin dosing.  Patient has been afebrile with slightly elevated WBC at 17.2 today and no signs on pneumonia on CXR. After discussion with CCM, however, patient has not been waking up off sedation and procalcitonin has been elevated at 12.45 today. CCM would like to optimize all therapies. Patient is ESRD on HD as an outpatient and is scheduled to have HD today.  Plan: -Vancomycin 2000 mg IV x1 now, and then 1000 mg x1 post-HD -Will monitor HD schedule for further dosing -Monitor clinical improvement, length of therapy, and vancomycin troughs as needed  Height: 5\' 7"  (170.2 cm) Weight: 182 lb 8.7 oz (82.8 kg) IBW/kg (Calculated) : 66.1  Temp (24hrs), Avg:98.6 F (37 C), Min:95.7 F (35.4 C), Max:100.2 F (37.9 C)  Recent Labs  Lab 2017/12/02 1313  11/05/17 0456  11/06/17 0429 11/06/17 1600 11/07/17 0259 11/07/17 0403 11/07/17 1645 11/08/17 0441  WBC  --   --  12.6*  --  14.9*  --  15.5* 15.5*  --  17.2*  CREATININE  --    < >  --    < > 2.21* 1.78* 1.80*  --  2.23* 2.75*  LATICACIDVEN 5.25*  --   --   --   --   --   --   --   --   --    < > = values in this interval not displayed.    Estimated Creatinine Clearance: 27.2 mL/min (A) (by C-G formula based on SCr of 2.75 mg/dL (H)).    Allergies  Allergen Reactions  . Lisinopril     Antimicrobials this admission: 10/23 Vancomycin >>  Dose adjustments this admission: n/a  Microbiology results: 10/19 BCx: NGTD 10/20 UCx: NGTD  10/20 TA: few MRSA  10/19 MRSA PCR: positive  Thank you for allowing pharmacy to be a part of this patient's care.  Arvilla Market, PharmD PGY1 Pharmacy Resident Phone [phone] 11/08/2017     9:42 AM

## 2017-11-08 NOTE — Progress Notes (Signed)
Transported pt to MRI with RN and back without any complications.

## 2017-11-08 NOTE — Progress Notes (Signed)
Presented to patient's room today due to request for IVC filter placement for RLE DVT - spoke with patient's son Laban Emperor who states understanding of indication for procedure and procedure itself, although he would like to wait until after patient has undergone dialysis today and after he discusses further procedures with his other brother.  I will return tomorrow to further discuss IVC filter placement.  Please call IR with questions or concerns.  Lynnette Caffey, PA-C

## 2017-11-08 NOTE — Consult Note (Addendum)
Neurology Consultation  Reason for Consult: Prognostication Referring Physician: Chase Caller  CC: Prognostication  History is obtained from: Chart  HPI: Guy Fry is a 67 y.o. male history of type 2 diabetes, renal failure, hypertension, hyperlipidemia, gout, CVA, atrial fibrillation.  Per chart patient presented to the hospital after the son noted that the patient was intermittently showing decreased alertness since July after he was diagnosed with a hematoma.  3 days prior to admission he was brought to [facility].  Patient apparently was found to have more right-sided weakness in the leg at that time.   Was brought to [facility] hospitalists due to increased weakness on the right side and dizziness.  While in the ED patient suffered a pulseless arrest lasting for approximately 20 minutes.  As of the 21st on exam he was chronically ill-appearing per RN on low fentanyl drip.  He is able to follow simple commands such as taking out his tongue after he was treated requests.  Neurosurgery was requested for the subdural hematoma however they stated he does not need neurosurgical intervention.  EEG showed generalized background slowing and triphasic waves which is most consistent with metabolic encephalopathy and nonspecific however there was no epileptiform activity noted.  Neurology was consulted for prognostication as family wants to discuss when to talk about terminal wean.    ROS:  Unable to obtain due to altered mental status.   Past Medical History:  Diagnosis Date  . Adjustment disorder   . Atrial fibrillation ([place])   . 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 CVA (cerebral vascular accident) ([place])   . GERD (gastroesophageal reflux disease)   . Gout   . HLD (hyperlipidemia)   . HTN (hypertension)   . Renal disorder   . Type 2 DM with hypertension and ESRD on dialysis [facility])      Family History  Problem Relation Age of Onset  . CAD Mother   . CAD Father       Social History:   reports that he has quit smoking. He has never used smokeless tobacco. He reports that he drank alcohol. He reports that he does not use drugs.  Medications  Current Facility-Administered Medications:  .  0.9 %  sodium chloride infusion (Manually program via Guardrails IV Fluids), , Intravenous, Once, [place], Raquel Sarna T, MD .  0.9 %  sodium chloride infusion (Manually program via Guardrails IV Fluids), , Intravenous, Once, Brand Males, MD .  chlorhexidine gluconate (MEDLINE KIT) (PERIDEX) 0.12 % solution 15 mL, 15 mL, Mouth Rinse, BID, Mannam, Praveen, MD, 15 mL at 11/08/17 0758 .  Chlorhexidine Gluconate Cloth 2 % PADS 6 each, 6 each, Topical, Daily, Mannam, Praveen, MD, 6 each at 11/08/17 1100 .  Chlorhexidine Gluconate Cloth 2 % PADS 6 each, 6 each, Topical, Q0600, Mannam, Praveen, MD, 6 each at 11/07/17 0502 .  famotidine (PEPCID) IVPB 20 mg premix, 20 mg, Intravenous, Q24H, Alvira Philips, [place], Stopped at 11/07/17 1700 .  feeding supplement (PRO-STAT SUGAR FREE 64) liquid 30 mL, 30 mL, Per Tube, TID, Mannam, Praveen, MD, 30 mL at 11/08/17 1100 .  feeding supplement (VITAL HIGH PROTEIN) liquid 1,000 mL, 1,000 mL, Per Tube, Q24H, Mannam, Praveen, MD, 1,000 mL at 11/07/17 1626 .  fentaNYL (SUBLIMAZE) bolus via infusion 25 mcg, 25 mcg, Intravenous, Q30 min PRN, Erick Colace, NP .  fentaNYL (SUBLIMAZE) injection 50 mcg, 50 mcg, Intravenous, Once, Salvadore Dom E, NP .  fentaNYL 2586mg in NS 2521m(1071mml) infusion-PREMIX, 100-300  mcg/hr, Intravenous, Continuous, Erick Colace, NP, Stopped at 11/07/17 0957 .  heparin injection 1,000-6,000 Units, 1,000-6,000 Units, CRRT, PRN, Roney Jaffe, MD, 3,000 Units at 11/07/17 0757 .  heparin injection 3,300 Units, 3.3 mL, Intravenous, Once, Coladonato, Joseph, MD .  insulin aspart (novoLOG) injection 0-9 Units, 0-9 Units, Subcutaneous, Q4H, Erick Colace, NP, 1 Units at 11/08/17 0830 .  MEDLINE mouth  rinse, 15 mL, Mouth Rinse, 10 times per day, Mannam, Praveen, MD, 15 mL at 11/08/17 1410 .  mupirocin ointment (BACTROBAN) 2 % 1 application, 1 application, Nasal, BID, Mannam, Praveen, MD, 1 application at 81/10/31 0801 .  prismasol BGK 4/2.5 5,000 mL dialysis replacement fluid, , CRRT, Continuous, Roney Jaffe, MD, Last Rate: 400 mL/hr at 11/07/17 0346 .  prismasol BGK 4/2.5 5,000 mL dialysis replacement fluid, , CRRT, Continuous, Roney Jaffe, MD, Last Rate: 200 mL/hr at 11/07/17 0347 .  prismasol BGK 4/2.5 5,000 mL dialysis solution, , CRRT, Continuous, Roney Jaffe, MD, Last Rate: 2,000 mL/hr at 11/07/17 0606 .  vancomycin (VANCOCIN) IVPB 1000 mg/200 mL premix, 1,000 mg, Intravenous, Once, Lore, Melissa A, RPH   Exam: Current vital signs: BP (!) 148/79   Pulse 100   Temp 98.9 F (37.2 C) (Oral)   Resp (!) 24   Ht '5\' 7"'$  (1.702 m)   Wt 82.8 kg   SpO2 99%   BMI 28.59 kg/m  Vital signs in last 24 hours: Temp:  [98.1 F (36.7 C)-100.2 F (37.9 C)] 98.9 F (37.2 C) (10/23 1503) Pulse Rate:  [77-110] 100 (10/23 1600) Resp:  [12-24] 24 (10/23 1456) BP: (114-188)/(62-93) 148/79 (10/23 1600) SpO2:  [94 %-100 %] 99 % (10/23 1600) FiO2 (%):  [50 %] 50 % (10/23 1456) Weight:  [82.8 kg] 82.8 kg (10/23 1400)  Physical Exam  Constitutional: Appears well-developed and well-nourished.  Psych: Affect appropriate to situation Eyes: No scleral injection HENT: No OP obstrucion Head: Normocephalic.  Cardiovascular: Normal rate and regular rhythm.  Respiratory: Effort normal, non-labored breathing GI: Soft.  No distension. There is no tenderness.  Skin: WDI  Neuro: Mental Status: Patient does not respond to verbal stimuli.  Grimaces to deep sternal rub.  Does not follow commands.  No verbalizations noted as he is intubated Cranial Nerves: II: patient does not respond confrontation bilaterally,  III,IV,VI: doll's response absent bilaterally. pupils right 2 mm, left 2 mm,and  sluggishly reactive bilaterally V,VII: corneal reflex present bilaterally  VIII: patient does not respond to verbal stimuli IX,X: gag reflex present, XI: trapezius strength unable to test bilaterally XII: tongue strength unable to test Motor: Patient spontaneously moves to noxious stimuli only no spontaneous movement. Sensory: Spots to noxious stimuli Deep Tendon Reflexes:  2+ in the ankle jerks, bicep and brachioradialis. Plantars: absent bilaterally Cerebellar: Unable to perform   Labs I have reviewed labs in epic and the results pertinent to this consultation are:   CBC    Component Value Date/Time   WBC 17.2 (H) 11/08/2017 0441   RBC 2.65 (L) 11/08/2017 0441   HGB 6.6 (LL) 11/08/2017 0441   HCT 22.3 (L) 11/08/2017 0441   PLT 169 11/08/2017 0441   MCV 84.2 11/08/2017 0441   MCH 24.9 (L) 11/08/2017 0441   MCHC 29.6 (L) 11/08/2017 0441   RDW 16.0 (H) 11/08/2017 0441   LYMPHSABS 1.5 10/29/2017 1110   MONOABS 1.3 (H) 10/29/2017 1110   EOSABS 0.3 10/29/2017 1110   BASOSABS 0.0 10/29/2017 1110    CMP     Component Value Date/Time  NA 140 11/08/2017 0441   K 3.1 (L) 11/08/2017 0441   CL 108 11/08/2017 0441   CO2 25 11/08/2017 0441   GLUCOSE 148 (H) 11/08/2017 0441   BUN 31 (H) 11/08/2017 0441   CREATININE 2.75 (H) 11/08/2017 0441   CALCIUM 8.0 (L) 11/08/2017 0441   PROT 5.4 (L) 11/02/2017 1308   ALBUMIN 1.5 (L) 11/08/2017 0441   AST 42 (H) 10/29/2017 1308   ALT 13 10/22/2017 1308   ALKPHOS 97 11/07/2017 1308   BILITOT 0.4 11/07/2017 1308   GFRNONAA 22 (L) 11/08/2017 0441   GFRAA 26 (L) 11/08/2017 0441    Lipid Panel  No results found for: CHOL, TRIG, HDL, CHOLHDL, VLDL, LDLCALC, LDLDIRECT   Imaging I have reviewed the images obtained:  CT-scan of the brain--showed increasing size of intermediate density extra-axial collection over the right parietal lobe with local mass-effect.  The read states it raises concern for acute on chronic subdural or epidural  hematoma.  MRI examination of the brain--to be obtained  Etta Quill PA-C [facility] [phone]  M-F  (9:00 am- 5:00 PM)  11/08/2017, 4:33 PM    ASSESSMENT AND PLAN   HPI: JOEPH SZATKOWSKI is a 67 y.o. male history of type 2 diabetes, renal failure, hypertension, hyperlipidemia, gout, CVA, atrial fibrillation.  Suffered PEA arrest for 20 minutes before ROSC  Anoxic injury s/p cardiac arrest  Toxic metabolic encephalopathy   Recommendation MRI Aaron Edelman w.o contrast for better prognostication Too early to prognosticate, only 4 days following anoxic injury.  Does open eyes to verbal commands.  However overall recovery will depend also patient's comorbidities-has subdural hemorrhage, end-stage renal disease, A. fib and old strokes.  Family not present at bedside to discuss, will check again tomorrow.

## 2017-11-09 DIAGNOSIS — I480 Paroxysmal atrial fibrillation: Secondary | ICD-10-CM

## 2017-11-09 DIAGNOSIS — N390 Urinary tract infection, site not specified: Secondary | ICD-10-CM

## 2017-11-09 DIAGNOSIS — S065X9A Traumatic subdural hemorrhage with loss of consciousness of unspecified duration, initial encounter: Secondary | ICD-10-CM

## 2017-11-09 DIAGNOSIS — I63132 Cerebral infarction due to embolism of left carotid artery: Secondary | ICD-10-CM

## 2017-11-09 DIAGNOSIS — I6340 Cerebral infarction due to embolism of unspecified cerebral artery: Secondary | ICD-10-CM

## 2017-11-09 DIAGNOSIS — Z978 Presence of other specified devices: Secondary | ICD-10-CM

## 2017-11-09 DIAGNOSIS — I82431 Acute embolism and thrombosis of right popliteal vein: Secondary | ICD-10-CM

## 2017-11-09 DIAGNOSIS — E875 Hyperkalemia: Secondary | ICD-10-CM

## 2017-11-09 LAB — TYPE AND SCREEN
ABO/RH(D): O POS
Antibody Screen: NEGATIVE
Unit division: 0
Unit division: 0

## 2017-11-09 LAB — BPAM RBC
BLOOD PRODUCT EXPIRATION DATE: 201911202359
Blood Product Expiration Date: 201911232359
ISSUE DATE / TIME: 201910220916
ISSUE DATE / TIME: 201910231428
UNIT TYPE AND RH: 5100
Unit Type and Rh: 5100

## 2017-11-09 LAB — CULTURE, BLOOD (ROUTINE X 2)
Culture: NO GROWTH
Culture: NO GROWTH
Special Requests: ADEQUATE
Special Requests: ADEQUATE

## 2017-11-09 LAB — MAGNESIUM: MAGNESIUM: 2.1 mg/dL (ref 1.7–2.4)

## 2017-11-09 LAB — RENAL FUNCTION PANEL
ALBUMIN: 1.7 g/dL — AB (ref 3.5–5.0)
Anion gap: 7 (ref 5–15)
BUN: 30 mg/dL — AB (ref 8–23)
CHLORIDE: 106 mmol/L (ref 98–111)
CO2: 27 mmol/L (ref 22–32)
CREATININE: 2.51 mg/dL — AB (ref 0.61–1.24)
Calcium: 8.2 mg/dL — ABNORMAL LOW (ref 8.9–10.3)
GFR calc Af Amer: 29 mL/min — ABNORMAL LOW (ref >60)
GFR, EST NON AFRICAN AMERICAN: 25 mL/min — AB (ref >60)
Glucose, Bld: 145 mg/dL — ABNORMAL HIGH (ref 70–99)
POTASSIUM: 3.4 mmol/L — AB (ref 3.5–5.1)
Phosphorus: <1 mg/dL — CL (ref 2.5–4.6)
Sodium: 140 mmol/L (ref 135–145)

## 2017-11-09 LAB — GLUCOSE, CAPILLARY
GLUCOSE-CAPILLARY: 147 mg/dL — AB (ref 70–99)
Glucose-Capillary: 158 mg/dL — ABNORMAL HIGH (ref 70–99)
Glucose-Capillary: 205 mg/dL — ABNORMAL HIGH (ref 70–99)

## 2017-11-09 LAB — APTT: APTT: 48 s — AB (ref 24–36)

## 2017-11-09 MED ORDER — DEXTROSE 5 % IV SOLN: INTRAVENOUS | Status: DC

## 2017-11-09 MED ORDER — SODIUM PHOSPHATES 45 MMOLE/15ML IV SOLN
10.0000 mmol | Freq: Once | INTRAVENOUS | Status: DC
  Administered 2017-11-09: 10 mmol via INTRAVENOUS
  Filled 2017-11-09: qty 3.33

## 2017-11-09 MED ORDER — PROPOFOL 1000 MG/100ML IV EMUL
5.0000 ug/kg/min | INTRAVENOUS | Status: DC
  Administered 2017-11-09: 30 ug/kg/min via INTRAVENOUS
  Administered 2017-11-09: 40 ug/kg/min via INTRAVENOUS
  Filled 2017-11-09: qty 100

## 2017-11-09 MED ORDER — PROPOFOL 1000 MG/100ML IV EMUL
INTRAVENOUS | Status: AC
  Administered 2017-11-09: 40 ug/kg/min via INTRAVENOUS
  Filled 2017-11-09: qty 100

## 2017-11-09 MED ORDER — FAMOTIDINE 40 MG/5ML PO SUSR: 20.0000 mg | Freq: Every day | ORAL | Status: DC

## 2017-11-09 MED ORDER — POTASSIUM CHLORIDE 20 MEQ/15ML (10%) PO SOLN
10.0000 meq | Freq: Once | ORAL | Status: AC
  Administered 2017-11-09: 10 meq
  Filled 2017-11-09: qty 15

## 2017-11-09 MED ORDER — MORPHINE SULFATE (PF) 2 MG/ML IV SOLN
1.0000 mg | INTRAVENOUS | Status: DC | PRN
  Administered 2017-11-09: 4 mg via INTRAVENOUS

## 2017-11-09 MED ORDER — SCOPOLAMINE 1 MG/3DAYS TD PT72
1.0000 | MEDICATED_PATCH | TRANSDERMAL | Status: DC
  Administered 2017-11-09: 1.5 mg via TRANSDERMAL
  Filled 2017-11-09: qty 1

## 2017-11-09 MED ORDER — MORPHINE 100MG IN NS 100ML (1MG/ML) PREMIX INFUSION
0.5000 mg/h | INTRAVENOUS | Status: DC
  Administered 2017-11-09: 6 mg/h via INTRAVENOUS
  Administered 2017-11-10 (×2): 8 mg/h via INTRAVENOUS
  Filled 2017-11-09 (×3): qty 100

## 2017-11-09 MED FILL — Medication: Qty: 1 | Status: AC

## 2017-11-09 NOTE — Progress Notes (Signed)
Nutrition Brief Note  Chart reviewed. Pt now transitioning to comfort care. Tube Feeding discontinued by MD No further nutrition interventions warranted at this time.  Please re-consult as needed.   Romelle Starcher MS, RD, LDN, CNSC [phone] Pager  [phone] Weekend/On-Call Pager

## 2017-11-09 NOTE — Progress Notes (Signed)
S:Neuro input appreciated.  MRI done yesterday and results noted.  Family is at bedside and are leaning towards comfort/hospice as they don't think that Guy. [place] would want a trach and LTC facility placement.  They want to hold HD today and follow for now.  O:BP (!) 183/87   Pulse 97   Temp 99.5 F (37.5 C) (Axillary)   Resp 20   Ht '5\' 7"'$  (1.702 m)   Wt 80 kg   SpO2 100%   BMI 27.62 kg/m   Intake/Output Summary (Last 24 hours) at 11/09/2017 1115 Last data filed at 11/09/2017 0600 Gross per 24 hour  Intake 2024.93 ml  Output 2000 ml  Net 24.93 ml   Intake/Output: I/O last 3 completed shifts: In: 3223.9 [Blood:315; NG/GT:2359; IV Piggyback:549.9] Out: 2000 [Other:2000]  Intake/Output this shift:  No intake/output data recorded. Weight change: 0 kg Gen: intubated and sedated CVS: no rub Resp: scattered rhonchi Abd: benign Ext: 3+ edema of R leg, 1+ on left  Recent Labs  Lab 10/22/2017 1308  11/06/17 0429 11/06/17 1600 11/07/17 0259 11/07/17 1645 11/08/17 0441 11/08/17 1738 11/09/17 0428  NA 141   < > 138 137 138 139 140 139 140  K 6.0*   < > 3.2* 3.8 3.7 3.0* 3.1* 3.4* 3.4*  CL 105   < > 104 105 107 108 108 105 106  CO2 23   < > '26 25 25 26 25 27 27  '$ GLUCOSE 153*   < > 72 95 120* 133* 148* 170* 145*  BUN 57*   < > '17 10 12 20 '$ 31* 19 30*  CREATININE 5.58*   < > 2.21* 1.78* 1.80* 2.23* 2.75* 1.63* 2.51*  ALBUMIN 1.4*   < > 1.7* 1.9* 1.7* 1.6* 1.5* 1.9* 1.7*  CALCIUM 11.1*   < > 8.2* 7.9* 7.9* 7.9* 8.0* 8.1* 8.2*  PHOS  --    < > 3.5  3.7 2.6 2.3*  2.3* 1.3* 1.2* <1.0* <1.0*  AST 42*  --   --   --   --   --   --   --   --   ALT 13  --   --   --   --   --   --   --   --    < > = values in this interval not displayed.   Liver Function Tests: Recent Labs  Lab 11/01/2017 1308  11/08/17 0441 11/08/17 1738 11/09/17 0428  AST 42*  --   --   --   --   ALT 13  --   --   --   --   ALKPHOS 97  --   --   --   --   BILITOT 0.4  --   --   --   --   PROT 5.4*  --   --    --   --   ALBUMIN 1.4*   < > 1.5* 1.9* 1.7*   < > = values in this interval not displayed.   No results for input(s): LIPASE, AMYLASE in the last 168 hours. No results for input(s): AMMONIA in the last 168 hours. CBC: Recent Labs  Lab 11/05/17 0456 11/06/17 0429 11/07/17 0259 11/07/17 0403 11/08/17 0441 11/08/17 1738  WBC 12.6* 14.9* 15.5* 15.5* 17.2*  --   HGB 8.4* 7.8* 6.6* 6.1* 6.6* 9.3*  HCT 26.9* 26.3* 22.2* 21.2* 22.3* 30.3*  MCV 81.3 84.3 85.7 85.8 84.2  --   PLT 198 175 141* 143*  169  --    Cardiac Enzymes: Recent Labs  Lab 10/19/2017 1308 11/07/2017 1556 11/16/2017 2006 11/05/17 0209 11/05/17 0911  TROPONINI 0.08* 0.23* 0.21* 0.19* 0.13*   CBG: Recent Labs  Lab 11/08/17 1254 11/08/17 1647 11/08/17 2026 11/09/17 0421 11/09/17 0809  GLUCAP 117* 145* 155* 158* 147*    Iron Studies: No results for input(s): IRON, TIBC, TRANSFERRIN, FERRITIN in the last 72 hours. Studies/Results: [facility] Contrast  Result Date: 11/08/2017 CLINICAL DATA:  67 y/o  M; increasing right-sided weakness. EXAM: MRI HEAD WITHOUT CONTRAST MRA HEAD WITHOUT CONTRAST TECHNIQUE: Multiplanar, multiecho pulse sequences of the brain and surrounding structures were obtained without intravenous contrast. Angiographic images of the head were obtained using MRA technique without contrast. COMPARISON:  10/20/2017 CT head. FINDINGS: MRI HEAD FINDINGS Brain: There are several small foci of reduced diffusion throughout the left cerebral hemisphere and the left basal ganglia in a watershed distribution compatible with acute/early subacute infarction. There are few foci of susceptibility hypointensity within the foci of infarction over the left cerebral convexity compatible with petechial hemorrhage. 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 Bilateral subdural collections measuring up  to 11 mm on the right and 6 mm on the left. The collections demonstrate hyperintense T2 signal with incomplete FLAIR suppression and mildly increased T1 signal. No hydrocephalus, focal mass effect of the brain, or herniation. Vascular: As below. Skull and upper cervical spine: Normal marrow signal. Sinuses/Orbits: Bilateral mastoid effusions. No abnormal signal of paranasal sinuses. Orbits are unremarkable. Other: None. MRA HEAD FINDINGS Internal carotid arteries: Patent right internal carotid artery. Mild irregularity of the right carotid siphon with mild paraclinoid stenosis. 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 Faint flow related signal is seen within the left carotid paraclinoid segment and terminus likely due to collateralization via the left posterior communicating artery and the anterior communicating artery. Anterior cerebral arteries:  Patent. Middle cerebral arteries: Patent. Anterior communicating artery: Patent. Posterior communicating arteries:  Patent. Posterior cerebral arteries: Patent. There is flow related signal within multiple tortuous venous structures along the posterior falx extending into the posterior fossa over the torcula with arterial contribution from the left PCA (series 1,037 image 15). Tortuous flow voids can be seen on the MRI of the brain (series 19, image 6). Basilar artery:  Patent. Vertebral arteries:  Patent. No evidence of high-grade stenosis, large vessel occlusion, or aneurysm unless noted above. IMPRESSION: MRI head: 1. Multiple foci of acute/early subacute infarction within the left watershed distribution of the brain. Small volume of petechial hemorrhage. No mass effect. 2. Right larger than left subdural collections with mildly increased signal indicating the presence of blood products, age indeterminate. Mild mass effect on the brain. No herniation. 3. Bilateral mastoid effusions. MRA head: 1. Occluded left internal carotid artery to the  paraclinoid segment. Flow related signal within left ICA paraclinoid segment and terminus due to left posterior communicating artery and anterior communicating artery collateralization. 2. No additional large vessel occlusion or high-grade stenosis. 3. 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 These results will be called to the ordering clinician or representative by the Radiologist Assistant, and communication documented in the PACS or zVision Dashboard. Electronically Signed   By: Kristine Garbe M.D.   On: 11/08/2017 22:44   [facility] Brain Wo Contrast  Result  Date: 11/08/2017 CLINICAL DATA:  67 y/o  M; increasing right-sided weakness. EXAM: MRI HEAD WITHOUT CONTRAST MRA HEAD WITHOUT CONTRAST TECHNIQUE: Multiplanar, multiecho pulse sequences of the brain and surrounding structures were obtained without intravenous contrast. Angiographic images of the head were obtained using MRA technique without contrast. COMPARISON:  11/16/2017 CT head. FINDINGS: MRI HEAD FINDINGS Brain: There are several small foci of reduced diffusion throughout the left cerebral hemisphere and the left basal ganglia in a watershed distribution compatible with acute/early subacute infarction. There are few foci of susceptibility hypointensity within the foci of infarction over the left cerebral convexity compatible with petechial hemorrhage. 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 Bilateral subdural collections measuring up to 11 mm on the right and 6 mm on the left. The collections demonstrate hyperintense T2 signal with incomplete FLAIR suppression and mildly increased T1 signal. No hydrocephalus, focal mass effect of the brain, or herniation. Vascular: As below.  Skull and upper cervical spine: Normal marrow signal. Sinuses/Orbits: Bilateral mastoid effusions. No abnormal signal of paranasal sinuses. Orbits are unremarkable. Other: None. MRA HEAD FINDINGS Internal carotid arteries: Patent right internal carotid artery. Mild irregularity of the right carotid siphon with mild paraclinoid stenosis. 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 Faint flow related signal is seen within the left carotid paraclinoid segment and terminus likely due to collateralization via the left posterior communicating artery and the anterior communicating artery. Anterior cerebral arteries:  Patent. Middle cerebral arteries: Patent. Anterior communicating artery: Patent. Posterior communicating arteries:  Patent. Posterior cerebral arteries: Patent. There is flow related signal within multiple tortuous venous structures along the posterior falx extending into the posterior fossa over the torcula with arterial contribution from the left PCA (series 1,037 image 15). Tortuous flow voids can be seen on the MRI of the brain (series 19, image 6). Basilar artery:  Patent. Vertebral arteries:  Patent. No evidence of high-grade stenosis, large vessel occlusion, or aneurysm unless noted above. IMPRESSION: MRI head: 1. Multiple foci of acute/early subacute infarction within the left watershed distribution of the brain. Small volume of petechial hemorrhage. No mass effect. 2. Right larger than left subdural collections with mildly increased signal indicating the presence of blood products, age indeterminate. Mild mass effect on the brain. No herniation. 3. Bilateral mastoid effusions. MRA head: 1. 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 Flow related signal within left ICA paraclinoid segment and terminus due to left posterior communicating artery and anterior communicating artery collateralization. 2. No additional large vessel occlusion or high-grade stenosis.  3. 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 These results will be called to the ordering clinician or representative by the Radiologist Assistant, and communication documented in the PACS or zVision Dashboard. Electronically Signed   By: Kristine Garbe M.D.   On: 11/08/2017 22:44   . sodium chloride   Intravenous Once  . sodium chloride   Intravenous Once  . chlorhexidine gluconate (MEDLINE KIT)  15 mL Mouth Rinse BID  . Chlorhexidine Gluconate Cloth  6 each Topical Daily  . Chlorhexidine Gluconate Cloth  6 each Topical Q0600  . famotidine  20 mg Per Tube QHS  . feeding supplement (PRO-STAT SUGAR FREE 64)  30 mL Per Tube TID  . feeding supplement (VITAL HIGH PROTEIN)  1,000 mL Per Tube Q24H  .  fentaNYL (SUBLIMAZE) injection  50 mcg Intravenous Once  . insulin aspart  0-9 Units Subcutaneous Q4H  . mouth rinse  15 mL Mouth Rinse 10 times per day    BMET    Component Value Date/Time   NA 140 11/09/2017 0428   K 3.4 (L) 11/09/2017 0428   CL 106 11/09/2017 0428   CO2 27 11/09/2017 0428   GLUCOSE 145 (H) 11/09/2017 0428   BUN 30 (H) 11/09/2017 0428   CREATININE 2.51 (H) 11/09/2017 0428   CALCIUM 8.2 (L) 11/09/2017 0428   GFRNONAA 25 (L) 11/09/2017 0428   GFRAA 29 (L) 11/09/2017 0428   CBC    Component Value Date/Time   WBC 17.2 (H) 11/08/2017 0441   RBC 2.65 (L) 11/08/2017 0441   HGB 9.3 (L) 11/08/2017 1738   HCT 30.3 (L) 11/08/2017 1738   PLT 169 11/08/2017 0441   MCV 84.2 11/08/2017 0441   MCH 24.9 (L) 11/08/2017 0441   MCHC 29.6 (L) 11/08/2017 0441   RDW 16.0 (H) 11/08/2017 0441   LYMPHSABS 1.5 10/29/2017 1110   MONOABS 1.3 (H) 10/29/2017 1110   EOSABS 0.3 10/29/2017 1110   BASOSABS 0.0 10/29/2017 1110    Dialysis: TTS at [facility], just started there 10/28/17 after moving from [place], [place] 4hr, 400/800,  EDW 82.5kg, 2K/2.25Ca, TDC, heparin 2000 bolus - Hectoral 64mg IV q HD - Venofer '50mg'$  IV weekly - Mircera 1025m IV q 2 weeks (has not been given yet)    Assessment/ Plan:  1. Cardiac arrest- prolonged down time with poor neurologic recoverybut starting to respond somewhat 2. Subdural hematoma- small increase but no surgical intervention per Neurosurgery. 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 tolerated IHD on 11/08/17.  Plan for HD tomorrow, however if he cannot tolerate it would not recommend restarting CVVHD and re-evaluate goals of care with family. 5. 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 Nutrition:renal 9. Hypertension:low bp and off meds 10. Right leg DVT, however can't anticoagulate due to SDH. Duplex + yesterday and IR consulted for IVC filter placement. 11. Disposition- poor overall prognosis. Family is leaning more towards transition to comfort and possible terminal wean from vent as they are sure he would not want a trach.   JoDonetta PottsMD [phone]

## 2017-11-09 NOTE — Progress Notes (Signed)
NAME:  Guy Fry, MRN:  [id number], DOB:  1950/03/18, LOS: 5 ADMISSION DATE:  11/08/2017, CONSULTATION DATE:  10/20/2017 REFERRING MD:  EDP, CHIEF COMPLAINT:  Cardiac arrest  Brief History   67 year old with end-stage renal disease.  Admitted for asystolic cardiac arrest after missed dialysis Total downtime approximately atleast 20 mins.  Noted to be hyperkalemic after resuscitation. Recently moved here from [place].  Past Medical History  End-stage renal disease, atrial fibrillation, diabetes type 2, CVA with right hemiparesis, chronic subdural hematoma, gout, GERD, hyperlipidemia. Significant Hospital Events   10/19- Admit with PEA arrest, started CRRT. CT head 10/19 > increasing size of subdural hematoma with local mass-effect.  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 10/21 - family wishing to pursue more diagnostic testing.  10/22 - Limited code. Sons not interested in LTAC/Trach. Time limited Rx for next few days DVT - RLE, IR consulted for IVC filter. CRRT changed to [facility]  11/08/17  - not on sedation. On iHD starting today. Not following commands - withdraws to mouth care or movement. Not on vasopressors. Low grade temp 99.2F. S/p PRBC yesterday but hgb flat 6's . No religious barrier to prbc per RN   Consults: date of consult/date signed off & final recs:  Nephrology 11/05/2017 >  Procedures (surgical and bedside):  ETT 10/19 >   Significant Diagnostic Tests:  CT head 10/19 > increasing size of subdural hematoma with local mass-effect.  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  Micro Data:  Bcx 10/19 Spcx 10/19 Urine culture 10/20 > MRSA  Antimicrobials:  .Vanc 10/23  (MRSA sputum) >>   SUBJECTIVE/OVERNIGHT/INTERVAL HX   11/09/2017 - seen by neuro. SBP 170, MAP 110, HR 103- sinus, MRI done by neuro - multiple small infracts in L MCA - c/w acute/subacute infact. R 11mm and LEft 6mm SDH. Mild mass effect. Bilateral mastoid effusion +  . Started on  fent gtt overnigh for MRI . S/P HD yesterday.   Family declined IVC filter for RLE dvt - "one more procedure maybe not needed"   Objective   Blood pressure (!) 183/87, pulse 97, temperature 99.5 F (37.5 C), temperature source Axillary, resp. rate 20, height 5\' 7"  (1.702 m), weight 80 kg, SpO2 100 %.    Vent Mode: PRVC FiO2 (%):  [40 %-50 %] 40 % Set Rate:  [18 bmp] 18 bmp Vt Set:  [530 mL] 530 mL PEEP:  [5 cmH20] 5 cmH20 Plateau Pressure:  [14 cmH20-23 cmH20] 14 cmH20   Intake/Output Summary (Last 24 hours) at 11/09/2017 0910 Last data filed at 11/09/2017 0600 Gross per 24 hour  Intake 2264.93 ml  Output 2000 ml  Net 264.93 ml   Filed Weights   11/08/17 1400 11/08/17 1733 11/09/17 0600  Weight: 82.8 kg 80.8 kg 80 kg  General Appearance:  Looks criticall ill OBESE - yes3 Head:  Normocephalic, without obvious abnormality, atraumatic Eyes:  PERRL - yes, conjunctiva/corneas - clear     Ears:  Normal external ear canals, both ears Nose:  G tube - no Throat:  ETT TUBE - yes , OG tube - yes Neck:  Supple,  No enlargement/tenderness/nodules Lungs: Clear to auscultation bilaterally, Ventilator   Synchrony - yes Heart:  S1 and S2 normal, no murmur, CVP - no.  Pressors - no Abdomen:  Soft, no masses, no organomegaly Genitalia / Rectal:  Not done Extremities:  Extremities- intact. RLE - warm /DVT + Skin:  ntact in exposed areas .  Neurologic:  Sedation - fent  gtt -> RASS - -3        LABS    PULMONARY Recent Labs  Lab 10/21/2017 1312 10/20/2017 1426 10/28/2017 1616 11/05/17 0530  PHART  --  7.355  --  7.398  PCO2ART  --  52.9*  --  46.9  PO2ART  --  128.0*  --  103  HCO3  --  29.4*  --  28.6*  TCO2 28 31 30   --   O2SAT  --  99.0  --  97.4    CBC Recent Labs  Lab 11/07/17 0259 11/07/17 0403 11/08/17 0441 11/08/17 1738  HGB 6.6* 6.1* 6.6* 9.3*  HCT 22.2* 21.2* 22.3* 30.3*  WBC 15.5* 15.5* 17.2*  --   PLT 141* 143* 169  --     COAGULATION Recent Labs  Lab  10/31/2017 1308 10/31/2017 1556 10/23/2017 2012  INR 1.24 1.18 1.23    CARDIAC   Recent Labs  Lab 11/07/2017 1308 10/25/2017 1556 11/09/2017 2006 11/05/17 0209 11/05/17 0911  TROPONINI 0.08* 0.23* 0.21* 0.19* 0.13*   No results for input(s): PROBNP in the last 168 hours.   CHEMISTRY Recent Labs  Lab 11/05/17 0500  11/06/17 0429  11/07/17 0259 11/07/17 1645 11/08/17 0441 11/08/17 1738 11/09/17 0428  NA 138   < > 138   < > 138 139 140 139 140  K 4.2   < > 3.2*   < > 3.7 3.0* 3.1* 3.4* 3.4*  CL 101   < > 104   < > 107 108 108 105 106  CO2 28   < > 26   < > 25 26 25 27 27   GLUCOSE 101*   < > 72   < > 120* 133* 148* 170* 145*  BUN 36*   < > 17   < > 12 20 31* 19 30*  CREATININE 3.53*   < > 2.21*   < > 1.80* 2.23* 2.75* 1.63* 2.51*  CALCIUM 8.4*   < > 8.2*   < > 7.9* 7.9* 8.0* 8.1* 8.2*  MG 2.1  --  2.2  --  2.3  --  2.2  --  2.1  PHOS 4.2   < > 3.5  3.7   < > 2.3*  2.3* 1.3* 1.2* <1.0* <1.0*   < > = values in this interval not displayed.   Estimated Creatinine Clearance: 29.4 mL/min (A) (by C-G formula based on SCr of 2.51 mg/dL (H)).   LIVER Recent Labs  Lab 10/21/2017 1308 11/16/2017 1556 11/08/2017 2012  11/07/17 0259 11/07/17 1645 11/08/17 0441 11/08/17 1738 11/09/17 0428  AST 42*  --   --   --   --   --   --   --   --   ALT 13  --   --   --   --   --   --   --   --   ALKPHOS 97  --   --   --   --   --   --   --   --   BILITOT 0.4  --   --   --   --   --   --   --   --   PROT 5.4*  --   --   --   --   --   --   --   --   ALBUMIN 1.4* 1.7*  --    < > 1.7* 1.6* 1.5* 1.9* 1.7*  INR 1.24 1.18 1.23  --   --   --   --   --   --    < > =  values in this interval not displayed.     INFECTIOUS Recent Labs  Lab 11-12-17 1313 11/05/17 1601 11/06/17 0429 11/07/17 0259  LATICACIDVEN 5.25*  --   --   --   PROCALCITON  --  20.47 23.32 12.45     ENDOCRINE CBG (last 3)  Recent Labs    11/08/17 2026 11/09/17 0421 11/09/17 0809  GLUCAP 155* 158* 147*          IMAGING x48h  - image(s) personally visualized  -   highlighted in bold Mr [facility] Contrast  Result Date: 11/08/2017 CLINICAL DATA:  67 y/o  M; increasing right-sided weakness. EXAM: MRI HEAD WITHOUT CONTRAST MRA HEAD WITHOUT CONTRAST TECHNIQUE: Multiplanar, multiecho pulse sequences of the brain and surrounding structures were obtained without intravenous contrast. Angiographic images of the head were obtained using MRA technique without contrast. COMPARISON:  11-12-17 CT head. FINDINGS: MRI HEAD FINDINGS Brain: There are several small foci of reduced diffusion throughout the left cerebral hemisphere and the left basal ganglia in a watershed distribution compatible with acute/early subacute infarction. There are few foci of susceptibility hypointensity within the foci of infarction over the left cerebral convexity compatible with petechial hemorrhage. 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 Bilateral subdural collections measuring up to 11 mm on the right and 6 mm on the left. The collections demonstrate hyperintense T2 signal with incomplete FLAIR suppression and mildly increased T1 signal. No hydrocephalus, focal mass effect of the brain, or herniation. Vascular: As below. Skull and upper cervical spine: Normal marrow signal. Sinuses/Orbits: Bilateral mastoid effusions. No abnormal signal of paranasal sinuses. Orbits are unremarkable. Other: None. MRA HEAD FINDINGS Internal carotid arteries: Patent right internal carotid artery. Mild irregularity of the right carotid siphon with mild paraclinoid stenosis. 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 Faint flow related signal is seen within the left carotid paraclinoid segment and terminus likely due to collateralization via the left posterior communicating artery and the anterior  communicating artery. Anterior cerebral arteries:  Patent. Middle cerebral arteries: Patent. Anterior communicating artery: Patent. Posterior communicating arteries:  Patent. Posterior cerebral arteries: Patent. There is flow related signal within multiple tortuous venous structures along the posterior falx extending into the posterior fossa over the torcula with arterial contribution from the left PCA (series 1,037 image 15). Tortuous flow voids can be seen on the MRI of the brain (series 19, image 6). Basilar artery:  Patent. Vertebral arteries:  Patent. No evidence of high-grade stenosis, large vessel occlusion, or aneurysm unless noted above. IMPRESSION: MRI head: 1. Multiple foci of acute/early subacute infarction within the left watershed distribution of the brain. Small volume of petechial hemorrhage. No mass effect. 2. Right larger than left subdural collections with mildly increased signal indicating the presence of blood products, age indeterminate. Mild mass effect on the brain. No herniation. 3. Bilateral mastoid effusions. MRA head: 1. 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 Flow related signal within left ICA paraclinoid segment and terminus due to left posterior communicating artery and anterior communicating artery collateralization. 2. No additional large vessel occlusion or high-grade stenosis. 3. 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 These results will be called to the ordering clinician or representative by the Radiologist Assistant, and communication documented in the PACS or zVision Dashboard. Electronically Signed  By: Mitzi Hansen M.D.   On: 11/08/2017 22:44   Mr Brain Wo Contrast  Result Date: 11/08/2017 CLINICAL DATA:  67 y/o  M; increasing right-sided weakness. EXAM: MRI HEAD WITHOUT CONTRAST MRA  HEAD WITHOUT CONTRAST TECHNIQUE: Multiplanar, multiecho pulse sequences of the brain and surrounding structures were obtained without intravenous contrast. Angiographic images of the head were obtained using MRA technique without contrast. COMPARISON:  11-05-17 CT head. FINDINGS: MRI HEAD FINDINGS Brain: There are several small foci of reduced diffusion throughout the left cerebral hemisphere and the left basal ganglia in a watershed distribution compatible with acute/early subacute infarction. There are few foci of susceptibility hypointensity within the foci of infarction over the left cerebral convexity compatible with petechial hemorrhage. 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 Bilateral subdural collections measuring up to 11 mm on the right and 6 mm on the left. The collections demonstrate hyperintense T2 signal with incomplete FLAIR suppression and mildly increased T1 signal. No hydrocephalus, focal mass effect of the brain, or herniation. Vascular: As below. Skull and upper cervical spine: Normal marrow signal. Sinuses/Orbits: Bilateral mastoid effusions. No abnormal signal of paranasal sinuses. Orbits are unremarkable. Other: None. MRA HEAD FINDINGS Internal carotid arteries: Patent right internal carotid artery. Mild irregularity of the right carotid siphon with mild paraclinoid stenosis. 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 Faint flow related signal is seen within the left carotid paraclinoid segment and terminus likely due to collateralization via the left posterior communicating artery and the anterior communicating artery. Anterior cerebral arteries:  Patent. Middle cerebral arteries: Patent. Anterior communicating artery: Patent. Posterior communicating arteries:  Patent. Posterior cerebral arteries: Patent. There is flow related signal within  multiple tortuous venous structures along the posterior falx extending into the posterior fossa over the torcula with arterial contribution from the left PCA (series 1,037 image 15). Tortuous flow voids can be seen on the MRI of the brain (series 19, image 6). Basilar artery:  Patent. Vertebral arteries:  Patent. No evidence of high-grade stenosis, large vessel occlusion, or aneurysm unless noted above. IMPRESSION: MRI head: 1. Multiple foci of acute/early subacute infarction within the left watershed distribution of the brain. Small volume of petechial hemorrhage. No mass effect. 2. Right larger than left subdural collections with mildly increased signal indicating the presence of blood products, age indeterminate. Mild mass effect on the brain. No herniation. 3. Bilateral mastoid effusions. MRA head: 1. 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 Flow related signal within left ICA paraclinoid segment and terminus due to left posterior communicating artery and anterior communicating artery collateralization. 2. No additional large vessel occlusion or high-grade stenosis. 3. 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 These results will be called to the ordering clinician or representative by the Radiologist Assistant, and communication documented in the PACS or zVision Dashboard. Electronically Signed   By: Mitzi Hansen M.D.   On: 11/08/2017 22:44     Recent Results (from the past 240 hour(s))  Culture, blood (Routine X 2) w Reflex to ID Panel     Status: None (Preliminary result)   Collection Time: November 05, 2017  4:29 PM  Result Value Ref Range Status   Specimen Description BLOOD RIGHT HAND  Final   Special Requests   Final    BOTTLES DRAWN AEROBIC AND ANAEROBIC Blood  Culture adequate volume   Culture   Final    NO GROWTH 4  DAYS Performed at [facility], [address] [place], [place] [address]    Report Status PENDING  Incomplete  Culture, blood (Routine X 2) w Reflex to ID Panel     Status: None (Preliminary result)   Collection Time: 11/06/2017  4:49 PM  Result Value Ref Range Status   Specimen Description BLOOD SITE NOT SPECIFIED  Final   Special Requests   Final    BOTTLES DRAWN AEROBIC AND ANAEROBIC Blood Culture adequate volume   Culture   Final    NO GROWTH 4 DAYS Performed at [facility], [address], [place], [place] [address]    Report Status PENDING  Incomplete  MRSA PCR Screening     Status: Abnormal   Collection Time: 11/09/2017  4:50 PM  Result Value Ref Range Status   MRSA by PCR POSITIVE (A) NEGATIVE Final    Comment:        The GeneXpert MRSA Assay (FDA approved for NASAL specimens only), is one component of a comprehensive MRSA colonization surveillance program. It is not intended to diagnose MRSA infection nor to guide or monitor treatment for MRSA infections. RBV J MELFORD RN 18:49 10/27/2017 gf   Culture, Urine     Status: None   Collection Time: 11/05/17 11:45 AM  Result Value Ref Range Status   Specimen Description URINE, CATHETERIZED  Final   Special Requests NONE  Final   Culture   Final    NO GROWTH Performed at [facility], [address] [place], [place] [address]    Report Status 11/06/2017 FINAL  Final  Culture, respiratory (non-expectorated)     Status: None   Collection Time: 11/05/17 12:24 PM  Result Value Ref Range Status   Specimen Description TRACHEAL ASPIRATE  Final   Special Requests NONE  Final   Gram Stain   Final    FEW WBC PRESENT, PREDOMINANTLY MONONUCLEAR FEW GRAM POSITIVE COCCI IN CLUSTERS RARE GRAM VARIABLE ROD Performed at [facility], [address] [place], [place] [address]    Culture FEW METHICILLIN RESISTANT STAPHYLOCOCCUS AUREUS  Final   Report Status 11/07/2017 FINAL  Final   Organism ID, Bacteria  METHICILLIN RESISTANT STAPHYLOCOCCUS AUREUS  Final      Susceptibility   Methicillin resistant staphylococcus aureus - MIC*    CIPROFLOXACIN >=8 RESISTANT Resistant     ERYTHROMYCIN >=8 RESISTANT Resistant     GENTAMICIN <=0.5 SENSITIVE Sensitive     OXACILLIN >=4 RESISTANT Resistant     TETRACYCLINE 2 SENSITIVE Sensitive     VANCOMYCIN 1 SENSITIVE Sensitive     TRIMETH/SULFA <=10 SENSITIVE Sensitive     CLINDAMYCIN >=8 RESISTANT Resistant     RIFAMPIN <=0.5 SENSITIVE Sensitive     Inducible Clindamycin NEGATIVE Sensitive     * FEW METHICILLIN RESISTANT STAPHYLOCOCCUS AUREUS     Resolved Hospital Problem list     Assessment & Plan:  67 year old with end-stage renal disease, cardiac arrest, hyperkalemia after missed dialysis  Cardiac arrest: PEA 30 mins. Felt to be secondary to hyperkalemia after missing HD. He remains encephalopathic secondary to this. Is now s/p 3 days CRRT and metabolic abnormalities have been resolved.  Hypothermia protocol deferred as subdural hemorrhage was bigger on presentation. Son reports a fall   11/09/2017 - maintains BP/HR and is ins sinus. A bit hypertensive Plan   - Supportive caer with MAP >  65 - IF BP hight might be agitation relaed and can do diprivan  Atrial fibrillation:   - 11/09/2017 -  HR 100 and sinus - monitor off flecainaide  End-stage renal disease -  Off CRRT since 11/07/17. S/p iHD on 11/08/17 -per relan  Electrolyte imbalance  - replete phos and K on 11/09/17  Anemia of chronic/critical illness - - s/p  1 unit PRBC 11/07/17 - 11/09/2017 -  - PRBC for hgb </= 6.9gm%    - exceptions are   -  if ACS susepcted/confirmed then transfuse for hgb </= 8.0gm%,  or    -  active bleeding with hemodynamic instability, then transfuse regardless of hemoglobin value   At at all times try to transfuse 1 unit prbc as possible with exception of active hemorrhage    Diabetes: has been hypoglycemic at times since admission - Hold  outpatient medication - SSI coverage - Maintenance fluids with D5  Prior CVA, H/O SDH in [place] in July 19 MRI 11/08/17 with new L MCA infarcts - Seen by neurosurgery.  Not a surgical candidate. - Seen by neuro for prognosis. Stroke service called  Possible sepsis/MRSA pneumonia - ? VAP/HCAP -  Continue vanc  RLE edema: Im told he is weak on R from prior CVA - RLE DVT + on 11/07/17  - no anticoagulation - no IVC Filter (family refused, did not see benefit)  Acute Encephalopathy  - neuro prognosis and wants "few more days" - dc fent gtt (given for MRI) - do diprivan gtt as needed   Disposition / Summary of Today's Plan 11/09/17   supporttive care    Diet: TF Pain/Anxiety/Delirium protocol (if indicated):  DVT prophylaxis: SCDs. Holding lovenox due to SDH GI prophylaxis: Pepcid Hyperglycemia protocol: SSI coverage Mobility: Bed rest Code Status: Limited code Family Communication: Sons updated at bedside 10/21.  Sons and sister update din detail 11/08/17. Awaited 11/09/17     ATTESTATION & SIGNATURE   The patient is critically ill with multiple organ systems failure and requires high complexity decision making for assessment and support, frequent evaluation and titration of therapies, application of advanced monitoring technologies and extensive interpretation of multiple databases.   Critical Care Time devoted to patient care services described in this note is  30  Minutes. This time reflects time of care of this signee Dr Kalman Shan. This critical care time does not reflect procedure time, or teaching time or supervisory time of PA/NP/Med student/Med Resident etc but could involve care discussion time     Dr. Kalman Shan, M.D., [facility].C.P Pulmonary and Critical Care Medicine Staff Physician [facility]monary and Critical Care Pager: [phone], If no answer or between  15:00h - 7:00h: call [phone]  11/09/2017 9:26  AM

## 2017-11-09 NOTE — Progress Notes (Signed)
Pt. Terminally extubated per order and family wishes, family stepped into hallway, family back in room once extubation complete. Marisue Ivan RN notified

## 2017-11-09 NOTE — Progress Notes (Signed)
Per RN family does not wish to proceed with IVC filter at this time, per chart leaning towards comfort/hospice care. They are awaiting discussion with neurology regarding prognosis.  Order for IVC filter has been discontinued - please reorder if needed in the future.  Please call IR with questions or concerns.  Lynnette Caffey, PA-C

## 2017-11-09 NOTE — Plan of Care (Signed)

## 2017-11-09 NOTE — Progress Notes (Signed)
  Interdisciplinary Goals of Care Family Meeting   Date carried out:: 11/09/2017  Location of the meeting: Bedside  Member's involved: Physician, Bedside Registered Nurse, Family Member or next of kin and Other: sons x 2  Durable Power of Attorney or acting medical decision maker: Sons    Discussion: We discussed goals of care for [employer] .  - they had refused IVC filter yesterday and refused HD earlier today. So, pre noon I d/w sons at bedside. Explained that neuro prognosis is uncertain but patient will require very likely LTAC/Trach and 1y mortality in HD patients out of ICU illness with encephalopathy is poor. Based on best interest and substituted judgement they felt comfort care was best. They were talking with family on timing. Now RN paged saying they decided to terminal wean at 17.30 11/09/2017  TERMINAL WEAN DISCUSSION  Explained concept of terminal wean  Explained how terminal wean works at the bedside Explained MD, RN, and RT role in this Explained that we are only stopping medicines and therapies  that are not effective and are only prolonging her suffering Explained that vent and pressors are not consistent with her goals Explained that we are still caring for patient by providing care aimed at comfort, agony, pain, distress  Explained post terminal wean we allow nature to take course    Explain predicting death post terminal is unpredictable but in patient Guy Fry with March 20, 1950 and [address] [place] [address] . However, likely to happen in hours/days but possibly weeks  Explained they can time the event based on personal needs and family/friends to gather  Explained that medicines used for comfort are morphine and benzo and explained the doctrine of double effect  They are appreciative and will time terminal wean later on 11/09/2017  when family gather    Code status: Full DNR  Disposition: In-patient comfort care  Time spent for the  meeting:  15 minutes  [name] 11/09/2017, 1:47 PM    ORDERS SIGN AND HELD PLACED - morphine gtt ordered but if gets agiated delrium then will need to rotate out of it     SIGNATURE    Dr. Kalman Shan, M.D., F.C.C.P,  Pulmonary and Critical Care Medicine Staff Physician, [facility] Director - Interstitial Lung Disease  Program  Pulmonary Fibrosis [facility] [place], [place], [address]  Pager: [phone], If no answer or between  15:00h - 7:00h: call [phone] Telephone: [phone]  1:50 PM 11/09/2017

## 2017-11-09 NOTE — Progress Notes (Signed)
Wasted 40mL of Fentanyl 251mcg/250mL into white med waste bin with Aline August, RN.

## 2017-11-09 NOTE — Procedures (Signed)
Extubation Procedure Note  Patient Details:   Name: Guy Fry DOB: 03-Jun-1950 MRN: [id number]   Airway Documentation:  Airway 7.5 mm (Active)  Secured at (cm) 25 cm 11/09/2017  3:28 PM  Measured From Lips 11/09/2017  3:28 PM  Secured Location Right 11/09/2017  3:28 PM  Secured By [employer] 11/09/2017  3:28 PM  Tube Holder Repositioned Yes 11/09/2017  3:28 PM  Cuff Pressure (cm H2O) 26 cm H2O 11/08/2017  8:08 PM  Site Condition Dry 11/09/2017  3:28 PM   Vent end date: 11/09/17 Vent end time: 2000   Evaluation  O2 sats: stable throughout Complications: No apparent complications Patient did not tolerate procedure well. Bilateral Breath Sounds: Clear, Diminished   No   Pt terminally extubated per family and MD   Lang Snow 11/09/2017, 8:18 PM

## 2017-11-09 NOTE — Progress Notes (Signed)
Per notification from sons. They wish to begin the withdrawal of care around 1730-1800 today when their mother is able to arrive to the hospital. Dr. Marchelle Gearing notified.

## 2017-11-09 NOTE — Progress Notes (Signed)
STROKE TEAM PROGRESS NOTE   SUBJECTIVE (INTERVAL HISTORY) His two sons are at the bedside. Pt is still intubated, briefly open eyes on voice but not moving extremities or following commands. MRI showed left hemisphere watershed infarcts and MRA showed left ICA occlusion. I had long discussion with sons at bedside, updated pt current condition, treatment plan and poor prognosis. They expressed understanding and will consider comfort care after discuss with other familes.     OBJECTIVE Temp:  [98.8 F (37.1 C)-99.8 F (37.7 C)] 99 F (37.2 C) (10/24 1200) Pulse Rate:  [82-110] 99 (10/24 1200) Cardiac Rhythm: Sinus tachycardia (10/24 0800) Resp:  [0-24] 14 (10/24 1200) BP: (124-211)/(63-93) 168/87 (10/24 1200) SpO2:  [93 %-100 %] 100 % (10/24 1200) FiO2 (%):  [40 %-50 %] 40 % (10/24 1120) Weight:  [80 kg-82.8 kg] 80 kg (10/24 0600)  Recent Labs  Lab 11/08/17 1647 11/08/17 2026 11/09/17 0421 11/09/17 0809 11/09/17 1124  GLUCAP 145* 155* 158* 147* 205*   Recent Labs  Lab 11/05/17 0500  11/06/17 0429  11/07/17 0259 11/07/17 1645 11/08/17 0441 11/08/17 1738 11/09/17 0428  NA 138   < > 138   < > 138 139 140 139 140  K 4.2   < > 3.2*   < > 3.7 3.0* 3.1* 3.4* 3.4*  CL 101   < > 104   < > 107 108 108 105 106  CO2 28   < > 26   < > 25 26 25 27 27   GLUCOSE 101*   < > 72   < > 120* 133* 148* 170* 145*  BUN 36*   < > 17   < > 12 20 31* 19 30*  CREATININE 3.53*   < > 2.21*   < > 1.80* 2.23* 2.75* 1.63* 2.51*  CALCIUM 8.4*   < > 8.2*   < > 7.9* 7.9* 8.0* 8.1* 8.2*  MG 2.1  --  2.2  --  2.3  --  2.2  --  2.1  PHOS 4.2   < > 3.5  3.7   < > 2.3*  2.3* 1.3* 1.2* <1.0* <1.0*   < > = values in this interval not displayed.   Recent Labs  Lab 10/31/2017 1308  11/07/17 0259 11/07/17 1645 11/08/17 0441 11/08/17 1738 11/09/17 0428  AST 42*  --   --   --   --   --   --   ALT 13  --   --   --   --   --   --   ALKPHOS 97  --   --   --   --   --   --   BILITOT 0.4  --   --   --   --   --    --   PROT 5.4*  --   --   --   --   --   --   ALBUMIN 1.4*   < > 1.7* 1.6* 1.5* 1.9* 1.7*   < > = values in this interval not displayed.   Recent Labs  Lab 11/05/17 0456 11/06/17 0429 11/07/17 0259 11/07/17 0403 11/08/17 0441 11/08/17 1738  WBC 12.6* 14.9* 15.5* 15.5* 17.2*  --   HGB 8.4* 7.8* 6.6* 6.1* 6.6* 9.3*  HCT 26.9* 26.3* 22.2* 21.2* 22.3* 30.3*  MCV 81.3 84.3 85.7 85.8 84.2  --   PLT 198 175 141* 143* 169  --    Recent Labs  Lab 11/09/2017 1308 10/23/2017 1556 11/03/2017  2006 11/05/17 0209 11/05/17 0911  TROPONINI 0.08* 0.23* 0.21* 0.19* 0.13*   No results for input(s): LABPROT, INR in the last 72 hours. No results for input(s): COLORURINE, LABSPEC, PHURINE, GLUCOSEU, HGBUR, BILIRUBINUR, KETONESUR, PROTEINUR, UROBILINOGEN, NITRITE, LEUKOCYTESUR in the last 72 hours.  Invalid input(s): APPERANCEUR  No results found for: CHOL, TRIG, HDL, CHOLHDL, VLDL, LDLCALC No results found for: HGBA1C No results found for: LABOPIA, COCAINSCRNUR, LABBENZ, AMPHETMU, THCU, LABBARB  No results for input(s): ETH in the last 168 hours.  I have personally reviewed the radiological images below and agree with the radiology interpretations.  Ct Head Wo Contrast  Addendum Date: Nov 24, 2017   ADDENDUM REPORT: 11-24-17 16:04 ADDENDUM: These results were called by telephone at the time of interpretation on 24-Nov-2017 at 4:04 pm to Dr. Isaiah Serge, who verbally acknowledged these results. Electronically Signed   By: Marin Roberts M.D.   On: 24-Nov-2017 16:04   Result Date: November 24, 2017 CLINICAL DATA:  Altered level of consciousness following CPR for cardiac arrest. EXAM: CT HEAD WITHOUT CONTRAST TECHNIQUE: Contiguous axial images were obtained from the base of the skull through the vertex without intravenous contrast. COMPARISON:  CT head 10/29/2017 FINDINGS: Brain: A right extra-axial intermediate density collection is increasing in size, now measuring 14.5 mm on coronal images compared with 11  mm previously. There is mass effect with effacement of the adjacent sulci. No significant midline shift is present. There is partial effacement the right lateral ventricle. No acute infarct or parenchymal hemorrhage is present. Basal ganglia are intact. Remote lacunar infarct of the left pons is noted. The brainstem and cerebellum are otherwise unremarkable. Vascular: Atherosclerotic calcifications are present within the cavernous internal carotid arteries bilaterally and at the dural margin of the vertebral arteries bilaterally. There is no hyperdense vessel. Skull: Teeth calvarium is intact. No focal lytic or blastic lesions are present. Sinuses/Orbits: The paranasal sinuses and mastoid air cells are clear. Globes and orbits are within normal limits. IMPRESSION: 1. Increasing size of intermediate density extra-axial collection over the right parietal lobe with local mass effect. This raises concern for acute on chronic subdural or epidural hematoma. No hyperdense blood products are present. 2. 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 No acute infarct. Electronically Signed: By: Marin Roberts M.D. On: 2017/11/24 16:00   Ct Head Wo Contrast  Result Date: 10/29/2017 CLINICAL DATA:  Focal neuro deficit.  Right-sided weakness. EXAM: CT HEAD WITHOUT CONTRAST TECHNIQUE: Contiguous axial images were obtained from the base of the skull through the vertex without intravenous contrast. COMPARISON:  None. FINDINGS: Brain: Moderate atrophy. Chronic microvascular ischemic type changes in the white matter. Right parietal extra-axial fluid collection intermediate to low-density measuring 12 mm in thickness. Fluid collection is focal suggesting a chronic subdural hematoma. No midline shift. No acute high-density hemorrhage. Negative for mass lesion. Vascular: Negative for hyperdense vessel High density is present in the left middle meningeal artery branches. Transcranial density extends into the left parietal  scalp. 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 No evidence of meningioma on the current study. No prior brain imaging available for comparison. Skull: Negative for skull lesion. Sinuses/Orbits: Negative Other: None IMPRESSION: Moderate atrophy and chronic ischemic change.  No acute infarct 12 mm right parietal low to intermediate density fluid collection most likely a chronic subdural hematoma. No midline shift Hyperdense material throughout the left middle meningeal artery most likely embolic material. Correlate with history. Electronically Signed   By: Marlan Palau M.D.   On: 10/29/2017 12:19  Mr Shirlee Latch ZO Contrast  Result Date: 11/08/2017 CLINICAL DATA:  67 y/o  M; increasing right-sided weakness. EXAM: MRI HEAD WITHOUT CONTRAST MRA HEAD WITHOUT CONTRAST TECHNIQUE: Multiplanar, multiecho pulse sequences of the brain and surrounding structures were obtained without intravenous contrast. Angiographic images of the head were obtained using MRA technique without contrast. COMPARISON:  2017-12-03 CT head. FINDINGS: MRI HEAD FINDINGS Brain: There are several small foci of reduced diffusion throughout the left cerebral hemisphere and the left basal ganglia in a watershed distribution compatible with acute/early subacute infarction. There are few foci of susceptibility hypointensity within the foci of infarction over the left cerebral convexity compatible with petechial hemorrhage. 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 Bilateral subdural collections measuring up to 11 mm on the right and 6 mm on the left. The collections demonstrate hyperintense T2 signal with incomplete FLAIR suppression and mildly increased T1 signal. No hydrocephalus, focal mass effect of the brain, or herniation. Vascular: As below. Skull and upper cervical spine: Normal marrow  signal. Sinuses/Orbits: Bilateral mastoid effusions. No abnormal signal of paranasal sinuses. Orbits are unremarkable. Other: None. MRA HEAD FINDINGS Internal carotid arteries: Patent right internal carotid artery. Mild irregularity of the right carotid siphon with mild paraclinoid stenosis. 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 Faint flow related signal is seen within the left carotid paraclinoid segment and terminus likely due to collateralization via the left posterior communicating artery and the anterior communicating artery. Anterior cerebral arteries:  Patent. Middle cerebral arteries: Patent. Anterior communicating artery: Patent. Posterior communicating arteries:  Patent. Posterior cerebral arteries: Patent. There is flow related signal within multiple tortuous venous structures along the posterior falx extending into the posterior fossa over the torcula with arterial contribution from the left PCA (series 1,037 image 15). Tortuous flow voids can be seen on the MRI of the brain (series 19, image 6). Basilar artery:  Patent. Vertebral arteries:  Patent. No evidence of high-grade stenosis, large vessel occlusion, or aneurysm unless noted above. IMPRESSION: MRI head: 1. Multiple foci of acute/early subacute infarction within the left watershed distribution of the brain. Small volume of petechial hemorrhage. No mass effect. 2. Right larger than left subdural collections with mildly increased signal indicating the presence of blood products, age indeterminate. Mild mass effect on the brain. No herniation. 3. Bilateral mastoid effusions. MRA head: 1. 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 Flow related signal within left ICA paraclinoid segment and terminus due to left posterior communicating artery and anterior communicating artery collateralization. 2. No additional large vessel occlusion or high-grade stenosis. 3. 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 These results will be called to the ordering clinician or representative by the Radiologist Assistant, and communication documented in the PACS or zVision Dashboard. Electronically Signed   By: Mitzi Hansen M.D.   On: 11/08/2017 22:44   Mr Brain Wo Contrast  Result Date: 11/08/2017 CLINICAL DATA:  67 y/o  M; increasing right-sided weakness. EXAM: MRI HEAD WITHOUT CONTRAST MRA HEAD WITHOUT CONTRAST TECHNIQUE: Multiplanar, multiecho pulse sequences of the brain and surrounding structures were obtained without intravenous contrast. Angiographic images of the head were obtained using MRA technique without contrast. COMPARISON:  12-03-17 CT head. FINDINGS: MRI HEAD FINDINGS Brain: There are several small foci of reduced diffusion throughout the left cerebral hemisphere  and the left basal ganglia in a watershed distribution compatible with acute/early subacute infarction. There are few foci of susceptibility hypointensity within the foci of infarction over the left cerebral convexity compatible with petechial hemorrhage. 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 Bilateral subdural collections measuring up to 11 mm on the right and 6 mm on the left. The collections demonstrate hyperintense T2 signal with incomplete FLAIR suppression and mildly increased T1 signal. No hydrocephalus, focal mass effect of the brain, or herniation. Vascular: As below. Skull and upper cervical spine: Normal marrow signal. Sinuses/Orbits: Bilateral mastoid effusions. No abnormal signal of paranasal sinuses. Orbits are unremarkable. Other: None. MRA HEAD FINDINGS Internal carotid arteries: Patent right internal carotid artery. Mild irregularity of the right  carotid siphon with mild paraclinoid stenosis. 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 Faint flow related signal is seen within the left carotid paraclinoid segment and terminus likely due to collateralization via the left posterior communicating artery and the anterior communicating artery. Anterior cerebral arteries:  Patent. Middle cerebral arteries: Patent. Anterior communicating artery: Patent. Posterior communicating arteries:  Patent. Posterior cerebral arteries: Patent. There is flow related signal within multiple tortuous venous structures along the posterior falx extending into the posterior fossa over the torcula with arterial contribution from the left PCA (series 1,037 image 15). Tortuous flow voids can be seen on the MRI of the brain (series 19, image 6). Basilar artery:  Patent. Vertebral arteries:  Patent. No evidence of high-grade stenosis, large vessel occlusion, or aneurysm unless noted above. IMPRESSION: MRI head: 1. Multiple foci of acute/early subacute infarction within the left watershed distribution of the brain. Small volume of petechial hemorrhage. No mass effect. 2. Right larger than left subdural collections with mildly increased signal indicating the presence of blood products, age indeterminate. Mild mass effect on the brain. No herniation. 3. Bilateral mastoid effusions. MRA head: 1. 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 Flow related signal within left ICA paraclinoid segment and terminus due to left posterior communicating artery and anterior communicating artery collateralization. 2. No additional large vessel occlusion or high-grade stenosis. 3. 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 These results will be called to the ordering clinician or representative by the Radiologist  Assistant, and communication documented in the PACS or zVision Dashboard. Electronically Signed   By: Mitzi Hansen M.D.   On: 11/08/2017 22:44    PHYSICAL EXAM  Temp:  [98.8 F (37.1 C)-99.8 F (37.7 C)] 99 F (37.2 C) (10/24 1200) Pulse Rate:  [82-110] 99 (10/24 1200) Resp:  [0-24] 14 (10/24 1200) BP: (124-211)/(63-93) 168/87 (10/24 1200) SpO2:  [93 %-100 %] 100 % (10/24 1200) FiO2 (%):  [40 %-50 %] 40 % (10/24 1120) Weight:  [80 kg-82.8 kg] 80 kg (10/24 0600)  General - Well nourished, well developed, intubated on sedation.  Ophthalmologic - fundi not visualized due to noncooperation.  Cardiovascular - Regular rate and rhythm, not in afib.  Neuro -intubated on sedation, eyes closed, but able to have a briefly open with voice.  Not following commands.  Eyes mid position, pupil equal, sluggish to light, positive corneal but weak positive, positive for cough.  Not blinking to visual threat bilaterally, not tracking.  On pain summation, no movement on all extremities.  DTR diminished, no Babinski. Sensation, coordination and  gait not tested.   ASSESSMENT/PLAN Mr. Guy Fry is a 67 y.o. male with history of afib, CVA, HTN, HLD, DM, ESRD on HD, chronic SDH residing in SNF admitted for right leg weakness and dizziness. Developed cardiac arrest, needed CPR for 20 minutes.  After resuscitation patient continued to have encephalopathy.  EEG done no seizure.  MRI showed left hemispheric watershed infarcts.   Stroke:  left hemisphere watershed infarcts including MCA/ACA and MCA/PCA territory, stroke likely due to acute on chronic left ICA stenosis/occlusion in the setting of cardiac arrest.  A. fib not on AC still on DDX.    Resultant intubated, less responsive  MRI left hemisphere watershed infarcts, chronic SDH right more than left.  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 They expressed understanding and appreciation. Recommend to consider comfort care given poor prognosis. Sons will consider after discuss with other families.  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  Given poor prognosis, recommend comfort care.  Discussed with son as above.  Encephalopathy  EEG general slow with triphasic waves, no seizure.  No exam showed barely open eyes and not moving all extremities on pain summation  Encephalopathy likely due to multiorgan failure  Poor prognosis  Chronic subdural hematoma  CT December 03, 2017 right more than left SDH, chronic  MRI this time showed slight increase of subdural hematoma, right more than left  Neurosurgery has consulted before no surgery indicated  Not anti-correlation candidate at this time  Commend comfort care, discussed with sons  PAF  Not on AC at baseline due to SDH  Currently not anticoagulated candidate  Cardiology on board  Recommend comfort care  Right lower extremity DVT  LE venous Doppler positive for right LE DVT  Not candidate for anticoagulation at this time  Not a candidate for IVC filter  Supportive care  Other Stroke Risk Factors  Advanced age  Hx stroke/TIA  HTN  HLD  DM  Other Active Problems  ESRD on dialysis  UTI - UA WBC > 50  Hospital day # 5  This patient is critically ill due to cardiac arrest, status post CPR, left brain infarct, left ICA occlusion, right LE DVT, PAF, ESRD, UTI and at significant risk of  neurological worsening, death form recurrent stroke, heart failure, hemorrhagic conversion, pulmonary emboli, sepsis, seizure. This patient's care requires constant monitoring of vital signs, hemodynamics, respiratory and cardiac monitoring, review of multiple databases, neurological assessment, discussion with family, other specialists and medical decision making of high complexity. I spent 40 minutes of neurocritical care time in the care of this patient. I had long discussion with sons at bedside, updated pt current condition, treatment plan and potential prognosis. They expressed understanding and appreciation. Recommend comfort care given poor prognosis. Sons will discuss with other family.  Neurology will sign off. Please call with questions. Thanks for the consult.   Marvel Plan, MD PhD Stroke Neurology 11/09/2017 12:51 PM  To contact Stroke Continuity provider, please refer to [email or web address] After hours, contact General Neurology

## 2017-11-10 DIAGNOSIS — Z515 Encounter for palliative care: Secondary | ICD-10-CM

## 2017-11-11 NOTE — Progress Notes (Addendum)
2230:Last noted with a carotid pulse  2301:Patient expired 23.01.  2307:Dr Ava Rakesh notified of patient's expiration. 2310: Called Son Guy Fry twice but did not receive any response.  2314:Then called son Guy Fry who answered and I informed him of expiration. Per son Guy Fry he was going to call me back and let me know if he will come and see body whiles on the floor. 2325: Call back received from Guy Fry, per Guy Fry he has also called his brother Guy Fry but he did not answer.  Guy Fry stated to nurse " I live an hour away so I will come over to the hospital tonight". 2332: [facility] called. Spoke with Joanne Chars.  0037: Son Darly at bedside. Belongings given to son 0230: Family left bed side 0250: Body prepared and  transported to [facility]

## 2017-11-13 ENCOUNTER — Telehealth: Payer: Self-pay | Admitting: Internal Medicine

## 2017-11-13 NOTE — Telephone Encounter (Signed)
11/13/17 Received DC/Cremation for Ramaswamy to Sign at [employer] from [employer].

## 2017-11-14 ENCOUNTER — Telehealth: Payer: Self-pay

## 2017-11-14 NOTE — Telephone Encounter (Signed)
On 11/14/17 I received the d/c back from Doctor Ramasway. I got the d/c ready and called the funeral home to let them know the d/c is ready for pickup. I also faxed a copy to the funeral home per the funeral home request.

## 2017-11-17 NOTE — Progress Notes (Signed)
Family decision to transition to comfort care noted.  S/p terminal wean.  No further HD.  Will sign off.  Please call with any questions or concerns. Continue with comfort care.

## 2017-11-17 NOTE — Progress Notes (Signed)
Wasted approximately 40ml of morphine with [place] RN

## 2017-11-17 NOTE — Progress Notes (Addendum)
NAME:  Guy Fry, MRN:  [id number], DOB:  [birth year], LOS: 6 ADMISSION DATE:  11/20/17, CONSULTATION DATE:  20-Nov-2017 REFERRING MD:  EDP, CHIEF COMPLAINT:  Cardiac arrest  Brief History   67 year old with end-stage renal disease.  Admitted for asystolic cardiac arrest after missed dialysis Total downtime approximately atleast 20 mins.  Noted to be hyperkalemic after resuscitation. Recently moved here from [place].  Past Medical History  End-stage renal disease, atrial fibrillation, diabetes type 2, CVA with right hemiparesis, chronic subdural hematoma, gout, GERD, hyperlipidemia. Significant Hospital Events   10/19- Admit with PEA arrest, started CRRT. CT head 10/19 > increasing size of subdural hematoma with local mass-effect.  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 10/21 - family wishing to pursue more diagnostic testing.  10/22 - Limited code. Sons not interested in LTAC/Trach. Time limited Rx for next few days DVT - RLE, IR consulted for IVC filter. CRRT changed to [facility]  11/08/17  - not on sedation. On iHD starting today. Not following commands - withdraws to mouth care or movement. Not on vasopressors. Low grade temp 99.22F. S/p PRBC yesterday but hgb flat 6's . No religious barrier to prbc per RN  seen by neuro. SBP 170, MAP 110, HR 103- sinus, MRI done by neuro - multiple small infracts in L MCA - c/w acute/subacute infact. R 11mm and LEft 6mm SDH. Mild mass effect. Bilateral mastoid effusion +  . Started on fent gtt overnigh for MRI . S/P HD yesterday.  Family declined IVC filter for RLE dvt - "one more procedure maybe not needed" and declined HD and opted for terminal wean   Consults: date of consult/date signed off & final recs:  Nephrology 11/20/17 >  Procedures (surgical and bedside):  ETT 10/19 >   Significant Diagnostic Tests:  CT head 10/19 > increasing size of subdural hematoma with local mass-effect.  Otherwise stable atrophy and white matter  disease.  Micro Data:  Bcx 10/19 Spcx 10/19 Urine culture 10/20 > MRSA  Antimicrobials:  .Vanc 10/23  (MRSA sputum) >>   SUBJECTIVE/OVERNIGHT/INTERVAL HX   11/13/2017 -s/p terminal wean yesterday for comfort care. Now on morphine 10mg /h. Ex wife and son Ladene Artist at bedside - they report grunting and increased morphine needs overnight. Currently comfortable. Anuric. FAcial muscles relaxed. Unresponsive  Objective   Blood pressure (!) 206/80, pulse 97, temperature 99 F (37.2 C), temperature source Axillary, resp. rate (!) 32, height 5\' 7"  (1.702 m), weight 80 kg, SpO2 91 %.    Vent Mode: PRVC FiO2 (%):  [40 %] 40 % Set Rate:  [18 bmp] 18 bmp Vt Set:  [530 mL] 530 mL PEEP:  [5 cmH20] 5 cmH20 Plateau Pressure:  [19 cmH20] 19 cmH20   Intake/Output Summary (Last 24 hours) at 10/27/2017 0845 Last data filed at 11/06/2017 0600 Gross per 24 hour  Intake 298.19 ml  Output -  Net 298.19 ml   Filed Weights   11/08/17 1400 11/08/17 1733 11/09/17 0600  Weight: 82.8 kg 80.8 kg 80 kg  General Appearance:  Looks terminal and comfortable Head:  Normocephalic, without obvious abnormality, atraumatic. FACIAL MUSCLES RELAXED Eyes:  PERRL - yes, conjunctiva/corneas - clear     Ears:  Normal external ear canals, both ears Nose:  G tube - no Throat:  ETT TUBE - no, OG tube - yno Neck:  Supple,  No enlargement/tenderness/nodules Lungs: CTA bilaterall Heart:  Normal heart sounds Abdomen:  Soft Genitalia / Rectal:  Not done Extremities:  Extremities- intact. RLE -  warm /DVT + Skin:  ntact in exposed areas .  Neurologic:  RASS -4       LABS    PULMONARY Recent Labs  Lab 11/13/2017 1312 Nov 13, 2017 1426 2017/11/13 1616 11/05/17 0530  PHART  --  7.355  --  7.398  PCO2ART  --  52.9*  --  46.9  PO2ART  --  128.0*  --  103  HCO3  --  29.4*  --  28.6*  TCO2 28 31 30   --   O2SAT  --  99.0  --  97.4    CBC Recent Labs  Lab 11/07/17 0259 11/07/17 0403 11/08/17 0441 11/08/17 1738   HGB 6.6* 6.1* 6.6* 9.3*  HCT 22.2* 21.2* 22.3* 30.3*  WBC 15.5* 15.5* 17.2*  --   PLT 141* 143* 169  --     COAGULATION Recent Labs  Lab 13-Nov-2017 1308 11/13/17 1556 11/13/17 2012  INR 1.24 1.18 1.23    CARDIAC   Recent Labs  Lab 11/13/17 1308 11-13-17 1556 13-Nov-2017 2006 11/05/17 0209 11/05/17 0911  TROPONINI 0.08* 0.23* 0.21* 0.19* 0.13*   No results for input(s): PROBNP in the last 168 hours.   CHEMISTRY Recent Labs  Lab 11/05/17 0500  11/06/17 0429  11/07/17 0259 11/07/17 1645 11/08/17 0441 11/08/17 1738 11/09/17 0428  NA 138   < > 138   < > 138 139 140 139 140  K 4.2   < > 3.2*   < > 3.7 3.0* 3.1* 3.4* 3.4*  CL 101   < > 104   < > 107 108 108 105 106  CO2 28   < > 26   < > 25 26 25 27 27   GLUCOSE 101*   < > 72   < > 120* 133* 148* 170* 145*  BUN 36*   < > 17   < > 12 20 31* 19 30*  CREATININE 3.53*   < > 2.21*   < > 1.80* 2.23* 2.75* 1.63* 2.51*  CALCIUM 8.4*   < > 8.2*   < > 7.9* 7.9* 8.0* 8.1* 8.2*  MG 2.1  --  2.2  --  2.3  --  2.2  --  2.1  PHOS 4.2   < > 3.5  3.7   < > 2.3*  2.3* 1.3* 1.2* <1.0* <1.0*   < > = values in this interval not displayed.   Estimated Creatinine Clearance: 29.4 mL/min (A) (by C-G formula based on SCr of 2.51 mg/dL (H)).   LIVER Recent Labs  Lab 2017-11-13 1308 2017/11/13 1556 November 13, 2017 2012  11/07/17 0259 11/07/17 1645 11/08/17 0441 11/08/17 1738 11/09/17 0428  AST 42*  --   --   --   --   --   --   --   --   ALT 13  --   --   --   --   --   --   --   --   ALKPHOS 97  --   --   --   --   --   --   --   --   BILITOT 0.4  --   --   --   --   --   --   --   --   PROT 5.4*  --   --   --   --   --   --   --   --   ALBUMIN 1.4* 1.7*  --    < > 1.7* 1.6* 1.5*  1.9* 1.7*  INR 1.24 1.18 1.23  --   --   --   --   --   --    < > = values in this interval not displayed.     INFECTIOUS Recent Labs  Lab 12-02-17 1313 11/05/17 1601 11/06/17 0429 11/07/17 0259  LATICACIDVEN 5.25*  --   --   --   PROCALCITON  --  20.47  23.32 12.45     ENDOCRINE CBG (last 3)  Recent Labs    11/09/17 0421 11/09/17 0809 11/09/17 1124  GLUCAP 158* 147* 205*         IMAGING x48h  - image(s) personally visualized  -   highlighted in bold Mr [facility] Contrast  Result Date: 11/08/2017 CLINICAL DATA:  67 y/o  M; increasing right-sided weakness. EXAM: MRI HEAD WITHOUT CONTRAST MRA HEAD WITHOUT CONTRAST TECHNIQUE: Multiplanar, multiecho pulse sequences of the brain and surrounding structures were obtained without intravenous contrast. Angiographic images of the head were obtained using MRA technique without contrast. COMPARISON:  12-02-17 CT head. FINDINGS: MRI HEAD FINDINGS Brain: There are several small foci of reduced diffusion throughout the left cerebral hemisphere and the left basal ganglia in a watershed distribution compatible with acute/early subacute infarction. There are few foci of susceptibility hypointensity within the foci of infarction over the left cerebral convexity compatible with petechial hemorrhage. 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 Bilateral subdural collections measuring up to 11 mm on the right and 6 mm on the left. The collections demonstrate hyperintense T2 signal with incomplete FLAIR suppression and mildly increased T1 signal. No hydrocephalus, focal mass effect of the brain, or herniation. Vascular: As below. Skull and upper cervical spine: Normal marrow signal. Sinuses/Orbits: Bilateral mastoid effusions. No abnormal signal of paranasal sinuses. Orbits are unremarkable. Other: None. MRA HEAD FINDINGS Internal carotid arteries: Patent right internal carotid artery. Mild irregularity of the right carotid siphon with mild paraclinoid stenosis. 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 Faint flow related signal is seen within the left carotid  paraclinoid segment and terminus likely due to collateralization via the left posterior communicating artery and the anterior communicating artery. Anterior cerebral arteries:  Patent. Middle cerebral arteries: Patent. Anterior communicating artery: Patent. Posterior communicating arteries:  Patent. Posterior cerebral arteries: Patent. There is flow related signal within multiple tortuous venous structures along the posterior falx extending into the posterior fossa over the torcula with arterial contribution from the left PCA (series 1,037 image 15). Tortuous flow voids can be seen on the MRI of the brain (series 19, image 6). Basilar artery:  Patent. Vertebral arteries:  Patent. No evidence of high-grade stenosis, large vessel occlusion, or aneurysm unless noted above. IMPRESSION: MRI head: 1. Multiple foci of acute/early subacute infarction within the left watershed distribution of the brain. Small volume of petechial hemorrhage. No mass effect. 2. Right larger than left subdural collections with mildly increased signal indicating the presence of blood products, age indeterminate. Mild mass effect on the brain. No herniation. 3. Bilateral mastoid effusions. MRA head: 1. 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 Flow related signal within left ICA paraclinoid segment and terminus due to left posterior communicating artery and anterior communicating artery collateralization. 2. No additional large vessel occlusion or high-grade stenosis. 3. 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 These results will be called to the ordering clinician or representative by the Radiologist Assistant, and communication documented in the PACS or zVision Dashboard. Electronically Signed   By: Mitzi Hansen M.D.   On: 11/08/2017 22:44   Mr Brain Wo  Contrast  Result Date: 11/08/2017 CLINICAL DATA:  67 y/o  M; increasing right-sided weakness. EXAM: MRI HEAD WITHOUT CONTRAST MRA HEAD WITHOUT CONTRAST TECHNIQUE: Multiplanar, multiecho pulse sequences of the brain and surrounding structures were obtained without intravenous contrast. Angiographic images of the head were obtained using MRA technique without contrast. COMPARISON:  11/05/2017 CT head. FINDINGS: MRI HEAD FINDINGS Brain: There are several small foci of reduced diffusion throughout the left cerebral hemisphere and the left basal ganglia in a watershed distribution compatible with acute/early subacute infarction. There are few foci of susceptibility hypointensity within the foci of infarction over the left cerebral convexity compatible with petechial hemorrhage. 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 Bilateral subdural collections measuring up to 11 mm on the right and 6 mm on the left. The collections demonstrate hyperintense T2 signal with incomplete FLAIR suppression and mildly increased T1 signal. No hydrocephalus, focal mass effect of the brain, or herniation. Vascular: As below. Skull and upper cervical spine: Normal marrow signal. Sinuses/Orbits: Bilateral mastoid effusions. No abnormal signal of paranasal sinuses. Orbits are unremarkable. Other: None. MRA HEAD FINDINGS Internal carotid arteries: Patent right internal carotid artery. Mild irregularity of the right carotid siphon with mild paraclinoid stenosis. 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 Faint flow related signal is seen within the left carotid paraclinoid segment and terminus likely due to collateralization via the left posterior communicating artery and the anterior communicating artery. Anterior cerebral arteries:  Patent. Middle cerebral arteries: Patent. Anterior communicating  artery: Patent. Posterior communicating arteries:  Patent. Posterior cerebral arteries: Patent. There is flow related signal within multiple tortuous venous structures along the posterior falx extending into the posterior fossa over the torcula with arterial contribution from the left PCA (series 1,037 image 15). Tortuous flow voids can be seen on the MRI of the brain (series 19, image 6). Basilar artery:  Patent. Vertebral arteries:  Patent. No evidence of high-grade stenosis, large vessel occlusion, or aneurysm unless noted above. IMPRESSION: MRI head: 1. Multiple foci of acute/early subacute infarction within the left watershed distribution of the brain. Small volume of petechial hemorrhage. No mass effect. 2. Right larger than left subdural collections with mildly increased signal indicating the presence of blood products, age indeterminate. Mild mass effect on the brain. No herniation. 3. Bilateral mastoid effusions. MRA head: 1. 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 Flow related signal within left ICA paraclinoid segment and terminus due to left posterior communicating artery and anterior communicating artery collateralization. 2. No additional large vessel occlusion or high-grade stenosis. 3. 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 These results will be called to the ordering clinician or representative by the Radiologist Assistant, and communication documented in the PACS or zVision Dashboard. Electronically Signed   By: Mitzi Hansen M.D.   On: 11/08/2017 22:44     Recent Results (from the past 240 hour(s))  Culture, blood (Routine X 2) w Reflex to ID Panel     Status: None   Collection Time: 11/02/2017  4:29 PM  Result Value  Ref Range Status   Specimen Description BLOOD RIGHT HAND  Final   Special Requests   Final     BOTTLES DRAWN AEROBIC AND ANAEROBIC Blood Culture adequate volume   Culture   Final    NO GROWTH 5 DAYS Performed at [facility], [address], [place], [place] [address]    Report Status 11/09/2017 FINAL  Final  Culture, blood (Routine X 2) w Reflex to ID Panel     Status: None   Collection Time: Nov 06, 2017  4:49 PM  Result Value Ref Range Status   Specimen Description BLOOD SITE NOT SPECIFIED  Final   Special Requests   Final    BOTTLES DRAWN AEROBIC AND ANAEROBIC Blood Culture adequate volume   Culture   Final    NO GROWTH 5 DAYS Performed at [facility], [address] [place], [place] [address]    Report Status 11/09/2017 FINAL  Final  MRSA PCR Screening     Status: Abnormal   Collection Time: Nov 06, 2017  4:50 PM  Result Value Ref Range Status   MRSA by PCR POSITIVE (A) NEGATIVE Final    Comment:        The GeneXpert MRSA Assay (FDA approved for NASAL specimens only), is one component of a comprehensive MRSA colonization surveillance program. It is not intended to diagnose MRSA infection nor to guide or monitor treatment for MRSA infections. RBV J MELFORD RN 18:49 November 06, 2017 gf   Culture, Urine     Status: None   Collection Time: 11/05/17 11:45 AM  Result Value Ref Range Status   Specimen Description URINE, CATHETERIZED  Final   Special Requests NONE  Final   Culture   Final    NO GROWTH Performed at [facility], [address] [place], [place] [address]    Report Status 11/06/2017 FINAL  Final  Culture, respiratory (non-expectorated)     Status: None   Collection Time: 11/05/17 12:24 PM  Result Value Ref Range Status   Specimen Description TRACHEAL ASPIRATE  Final   Special Requests NONE  Final   Gram Stain   Final    FEW WBC PRESENT, PREDOMINANTLY MONONUCLEAR FEW GRAM POSITIVE COCCI IN CLUSTERS RARE GRAM VARIABLE ROD Performed at [facility], [address], [place], [place] [address]    Culture FEW METHICILLIN RESISTANT  STAPHYLOCOCCUS AUREUS  Final   Report Status 11/07/2017 FINAL  Final   Organism ID, Bacteria METHICILLIN RESISTANT STAPHYLOCOCCUS AUREUS  Final      Susceptibility   Methicillin resistant staphylococcus aureus - MIC*    CIPROFLOXACIN >=8 RESISTANT Resistant     ERYTHROMYCIN >=8 RESISTANT Resistant     GENTAMICIN <=0.5 SENSITIVE Sensitive     OXACILLIN >=4 RESISTANT Resistant     TETRACYCLINE 2 SENSITIVE Sensitive     VANCOMYCIN 1 SENSITIVE Sensitive     TRIMETH/SULFA <=10 SENSITIVE Sensitive     CLINDAMYCIN >=8 RESISTANT Resistant     RIFAMPIN <=0.5 SENSITIVE Sensitive     Inducible Clindamycin NEGATIVE Sensitive     * FEW METHICILLIN RESISTANT STAPHYLOCOCCUS AUREUS     Resolved Hospital Problem list     Assessment & Plan:  67 year old with end-stage renal disease, cardiac arrest, hyperkalemia after missed dialysis  Cardiac arrest: PEA 30 mins. Felt to be secondary to hyperkalemia after missing HD. He remains encephalopathic secondary to this. Is now s/p 3 days CRRT and metabolic abnormalities have been resolved.  Hypothermia protocol deferred as subdural hemorrhage was bigger  on presentation. Son reports a fall  Atrial fibrillation:  Off flecainide.    End-stage renal disease -  Off CRRT since 11/07/17. S/p iHD on 11/08/17 -- family has opted for comfor tcare  Electrolyte imbalance - - repleted phos and K on 11/09/17  Anemia of chronic/critical illness - - s/p  1 unit PRBC 11/07/17   Diabetes:  Prior CVA, H/O SDH in [place] in July 19 MRI 11/08/17 with new L MCA infarcts - Possible sepsis/MRSA pneumonia - ? VAP/HCAP   RLE edema: Im told he is weak on R from prior CVA - RLE DVT + on 11/07/17  - no anticoagulation - no IVC Filter (family refused, did not see benefit)  Acute Encephalopathy    Disposition / Summary of Today's Plan 11/09/2017   TERMINAL CARE/COMFORT CARE - currently comfortable on moprhine 10mg  /h. ASked RN to taper down to extent possible. Monitor  for agitated delirum with morphine and renal failure - so far none. Consider benzo if needed. Life expectancy - few to several days. Move to palliative floor   D/wDr Criss Alvine PGY 1 of IMTS - they will take over as primary 7am 11/11/17     SIGNATURE    Dr. Kalman Shan, M.D., F.C.C.P,  Pulmonary and Critical Care Medicine Staff Physician, [facility] Director - Interstitial Lung Disease  Program  Pulmonary Fibrosis [facility] at [facility] [place], [place], [address]  Pager: [phone], If no answer or between  15:00h - 7:00h: call [phone] Telephone: [phone]  8:51 AM 10/25/2017

## 2017-11-17 NOTE — Progress Notes (Signed)
Nurse called to room by patients family, pt. Displaying agonal breathing, pt. Family stated they think that patient is uncomfortable, express desire to turn up the morphine drip. Morphine drip titrated from 8 to 10 mg/hour. 4 mg bolus also provided. Support provided to family, questions answered. Will continue to monitor

## 2017-11-17 NOTE — Plan of Care (Signed)
  Problem: Coping: Goal: Level of anxiety will decrease Outcome: Progressing   Problem: Pain Managment: Goal: General experience of comfort will improve Outcome: Progressing   Problem: Safety: Goal: Ability to remain free from injury will improve Outcome: Progressing   Problem: Role Relationship: Goal: Family's ability to cope with current situation will improve Outcome: Progressing Goal: Ability to verbalize concerns, feelings, and thoughts to partner or family member will improve Outcome: Progressing   Problem: Pain Management: Goal: Satisfaction with pain management regimen will improve Outcome: Progressing

## 2017-11-17 NOTE — Progress Notes (Signed)
   10/26/2017 0900  Clinical Encounter Type  Visited With Patient and family together  Visit Type Follow-up  Referral From Nurse  Consult/Referral To Chaplain  Spiritual Encounters  Spiritual Needs Prayer;Emotional  Stress Factors  Patient Stress Factors None identified  Family Stress Factors None identified   Responded to advisory consult. PT was unresponsive and family was at bedside. Family stated they were recently informed by Doctor about the future care of PT.  I offered spiritual care with ministry of presence, word of encouragement, a listening ear and prayer. Chaplain available as needed.   Chaplain Orest Dikes [phone]

## 2017-11-17 NOTE — Social Work (Signed)
CSW acknowledging consult for comfort care. Will follow for disposition should hospice services or residential hospice become appropriate.   Shivaun Bilello H Evrett Hakim, LCSWA [place] Clinical Social Work [phone]   

## 2017-11-17 DEATH — deceased

## 2017-12-17 NOTE — Discharge Summary (Signed)
DISCHARGE SUMMARY    Date of admit: 10/23/2017 12:53 PM Date of discharge: 08/29/2017 11:01 PM Length of Stay: 6 days  PCP is Eloisa NorthernAmin, Saad, MD   PROBLEM LIST Active Problems:   Cardiac arrest (HCC)   Pressure injury of skin   Cerebral embolism with cerebral infarction    SUMMARY Guy Fry was 67 y.o. patient with    has a past medical history of Adjustment disorder, Atrial fibrillation (HCC), Chronic subdural hematoma (HCC), CVA (cerebral vascular accident) (HCC), GERD (gastroesophageal reflux disease), Gout, HLD (hyperlipidemia), HTN (hypertension), Renal disorder, and Type 2 DM with hypertension and ESRD on dialysis (HCC).   has no past surgical history on file.   Admitted on 11/03/2017 with   67 year old with end-stage renal disease.  Admitted for asystolic cardiac arrest after missed dialysis Total downtime approximately atleast 20 mins.  Noted to be hyperkalemic after resuscitation. [place]   10/19- Admit with PEA arrest, started CRRT. CT head 10/19 > increasing size of subdural hematoma with local mass-effect.  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 10/21 - family wishing to pursue more diagnostic testing.  10/22 - Limited code. Sons not interested in LTAC/Trach. Time limited Rx for next few days DVT - RLE, IR consulted for IVC filter. [facility]  11/[facility]  - not on sedation. On iHD starting today. Not following commands - withdraws to mouth care or movement. Not on vasopressors. Low grade temp 99.68F. S/p PRBC yesterday but hgb flat 6's . No religious barrier to prbc per RN  seen by neuro. SBP 170, MAP 110, HR 103- sinus, MRI done by neuro - multiple small infracts in L MCA - c/w acute/subacute infact. R 11mm and LEft 6mm SDH. Mild mass effect. Bilateral mastoid effusion +  . Started on fent gtt overnigh for MRI . S/P HD yesterday.  Family declined IVC filter for RLE dvt - "one more procedure maybe not needed"  and declined HD and opted for terminal wean   08/29/2017 -s/p terminal wean yesterday for comfort care. Now on morphine 10mg /h. Ex wife and son Ladene ArtistDerrick at bedside - they report grunting and increased morphine needs overnight. Currently comfortable. Anuric. FAcial muscles relaxed. Unresponsive   Expired 08/29/2017      SIGNED Dr. Kalman ShanMurali Aviela Blundell, M.D., F.C.C.P Pulmonary and Critical Care Medicine Staff Physician [facility] [place] [facility] Pager: [phone], If no answer or between  15:00h - 7:00h: call [phone]  11/28/2017 1:22 PM

## 2020-01-02 IMAGING — DX DG CHEST 1V PORT
1 series · 1 of 1 positions shown · non-contrast
Comparison: None.

CLINICAL DATA: RIGHT-sided leg weakness, history of CVA 1 month ago
with RIGHT-sided deficits.

EXAM:
PORTABLE CHEST 1 VIEW

[chest ap]
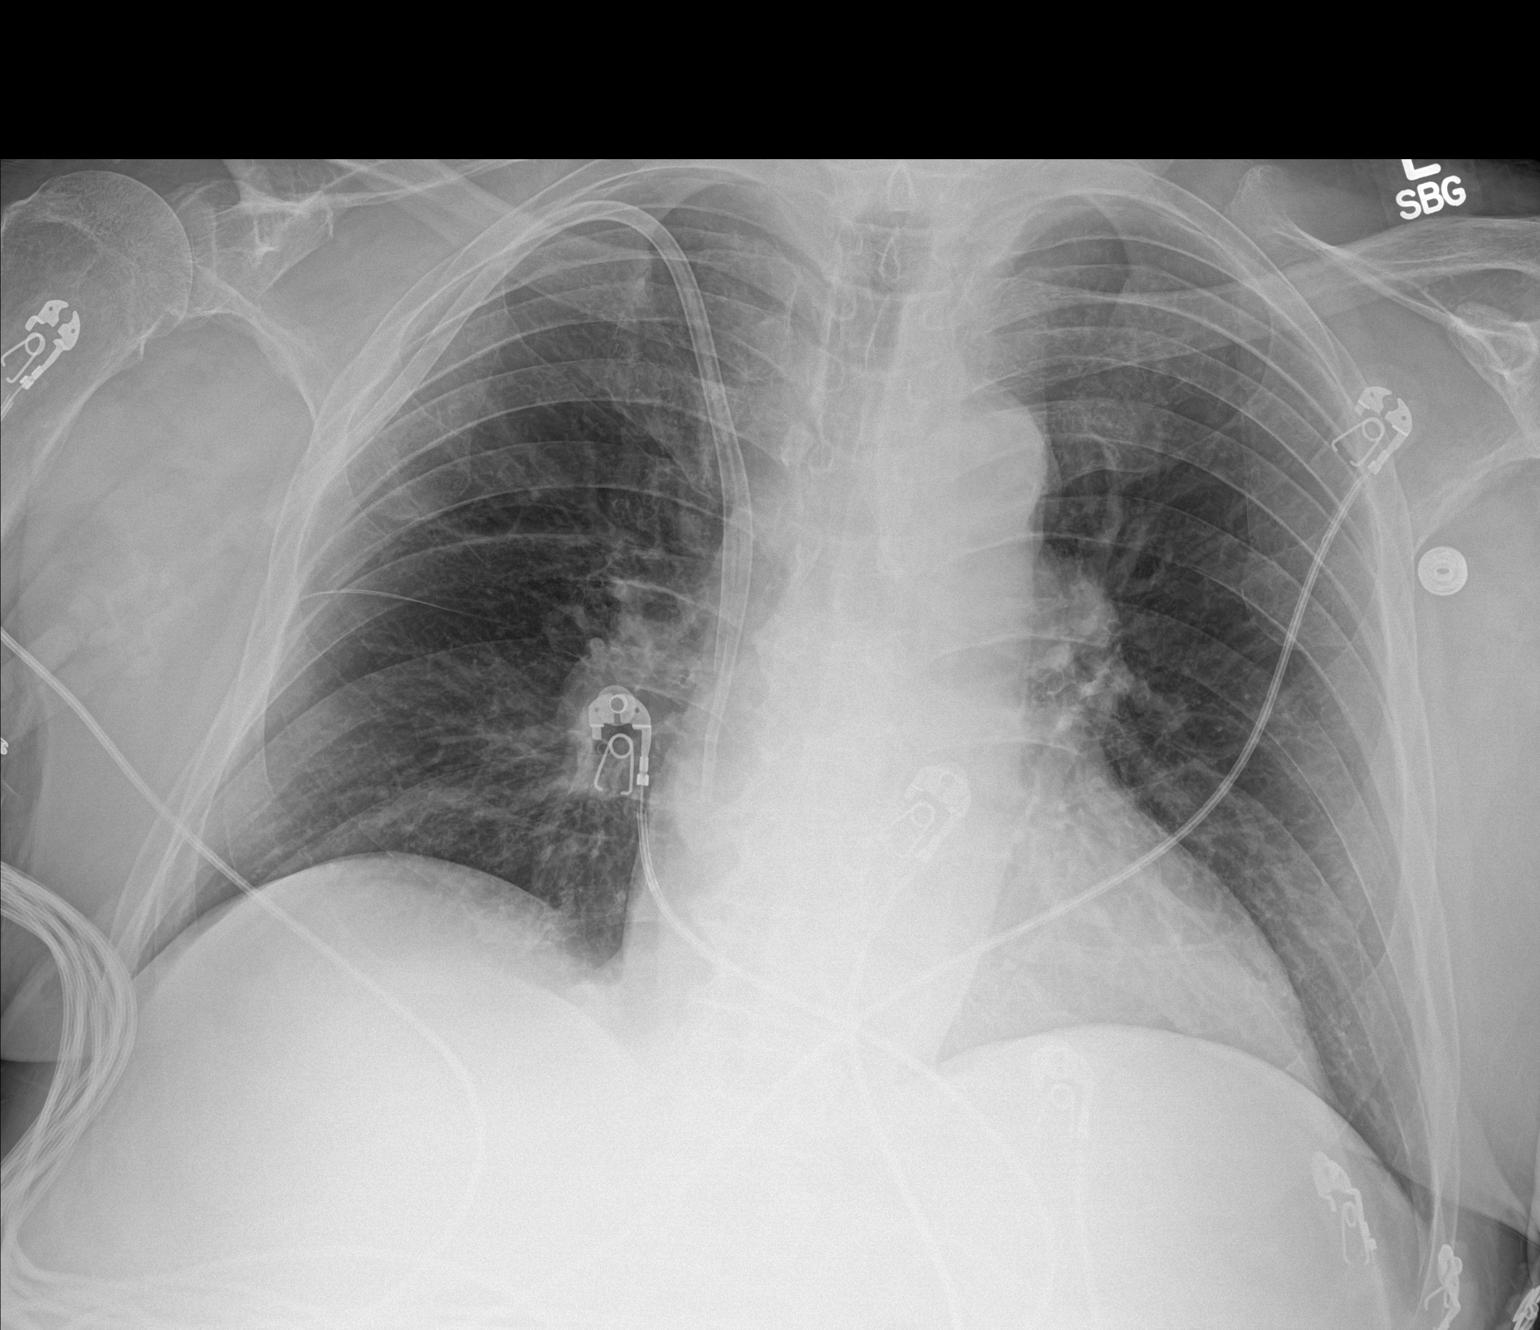

[1 of 1 positions shown; findings below may reference images not displayed]

FINDINGS: Heart size is upper normal. RIGHT-sided dialysis catheter appears
appropriately position with tip at the level of the lower
SVC/cavoatrial junction. Lungs are clear. No pleural effusion or
pneumothorax seen. No acute or suspicious osseous finding.
IMPRESSION: No active disease.  No evidence of pneumonia or pulmonary edema.

## 2020-01-08 IMAGING — CT CT HEAD W/O CM
4 series · 15 of 47 positions shown, 17 images · non-contrast
Comparison: CT head 10/29/2017

ADDENDUM:
These results were called by telephone at the time of interpretation
on 11/04/2017 at [DATE] to Dr. Kranz, who verbally acknowledged
these results.
CLINICAL DATA: Altered level of consciousness following CPR for
cardiac arrest.

EXAM:
CT HEAD WITHOUT CONTRAST
TECHNIQUE: Contiguous axial images were obtained from the base of the skull
through the vertex without intravenous contrast.

[Series 3: head without · axial · non-contrast · 0.41mm/px · z∈[-75,+45]mm · 7 of 33 slices shown, 9 images]
[im 5/33  brain]
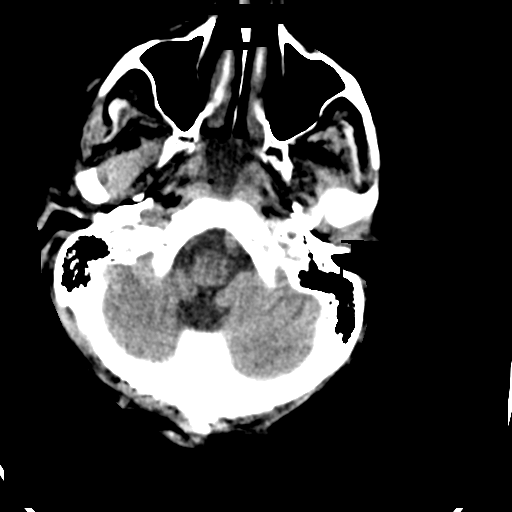
[im 5/33  bone]
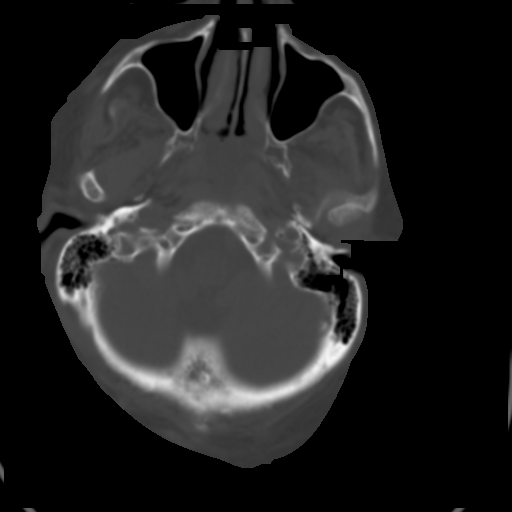
[im 9/33  brain]
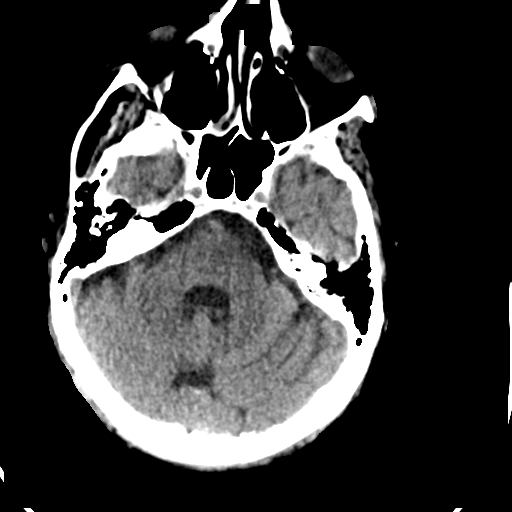
[im 13/33  brain]
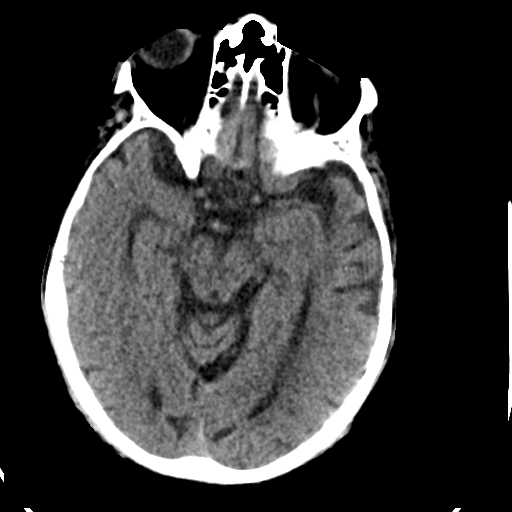
[im 17/33  brain]
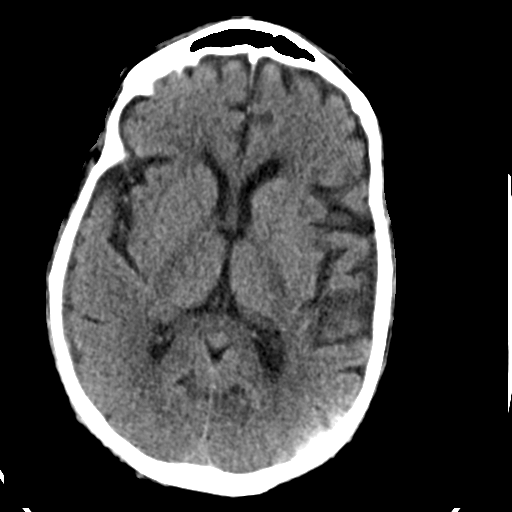
[im 21/33  brain]
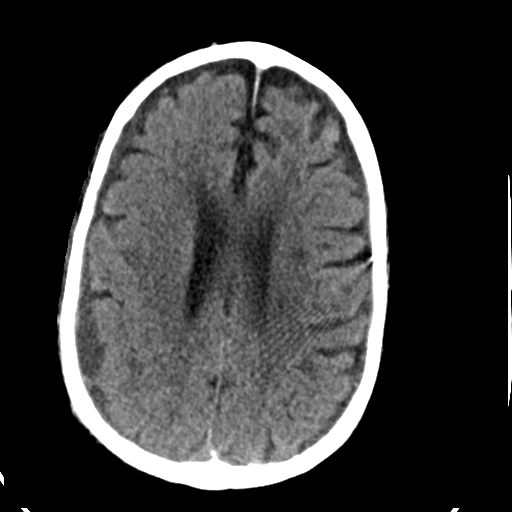
[im 21/33  bone]
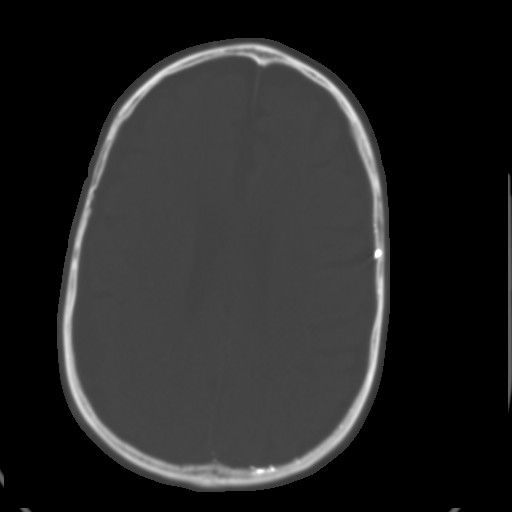
[im 25/33  brain]
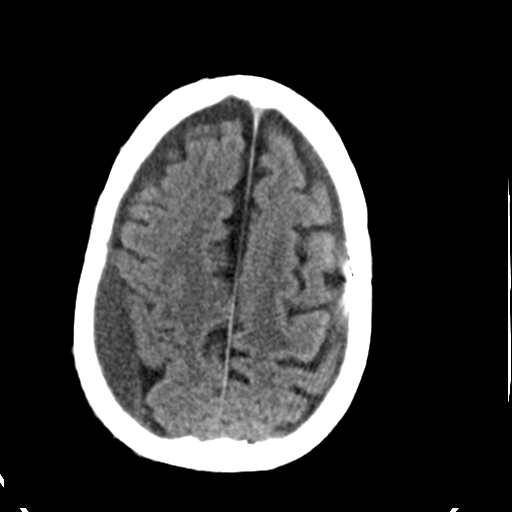
[im 29/33  brain]
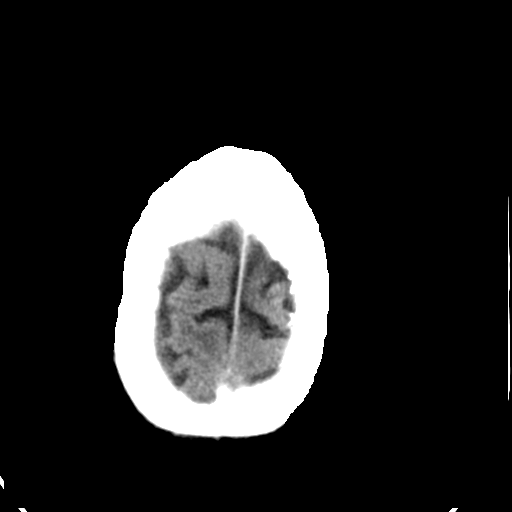

[Series 4: head bone · axial · 0.41mm/px · z∈[-79,-63]mm · 2 of 81 slices shown]
[im 9/81  bone]
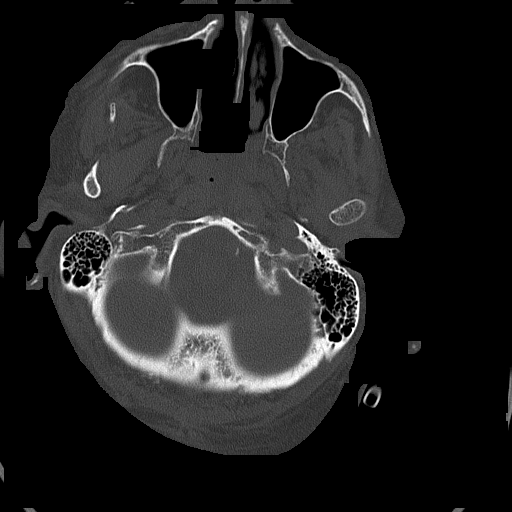
[im 17/81  bone]
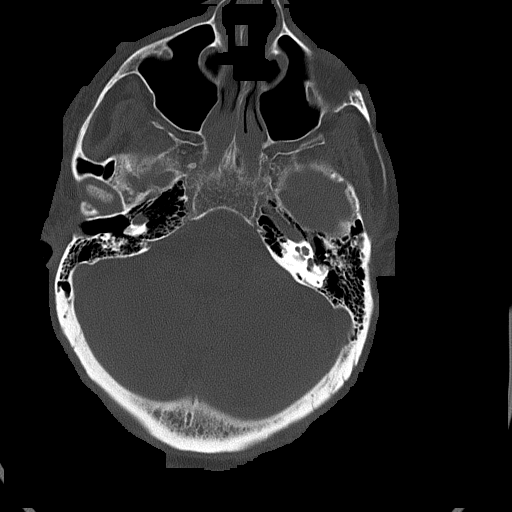

[Series 5: head without cor · coronal · non-contrast · 0.32mm/px · 3 of 72 slices shown]
[im 24/72  brain]
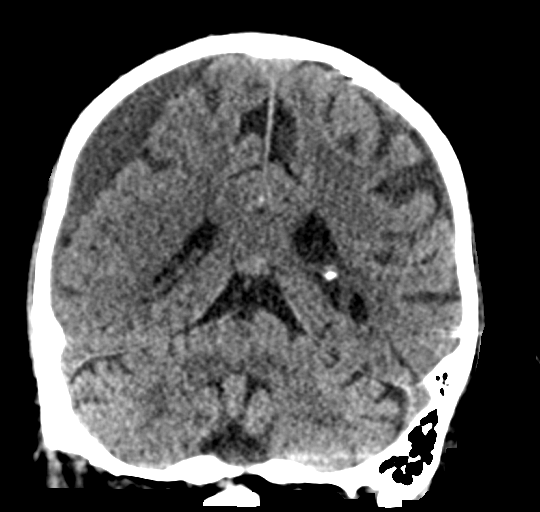
[im 32/72  brain]
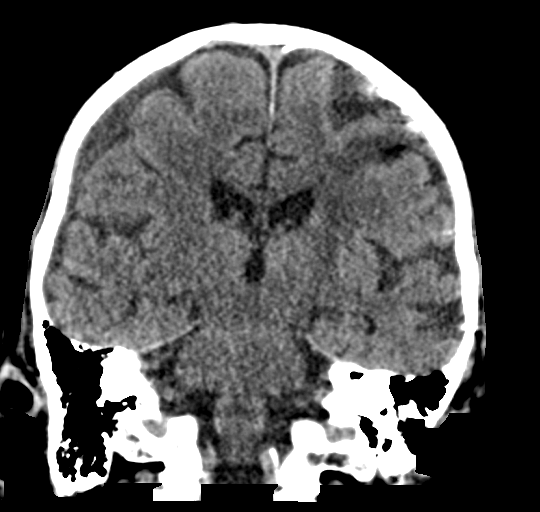
[im 40/72  brain]
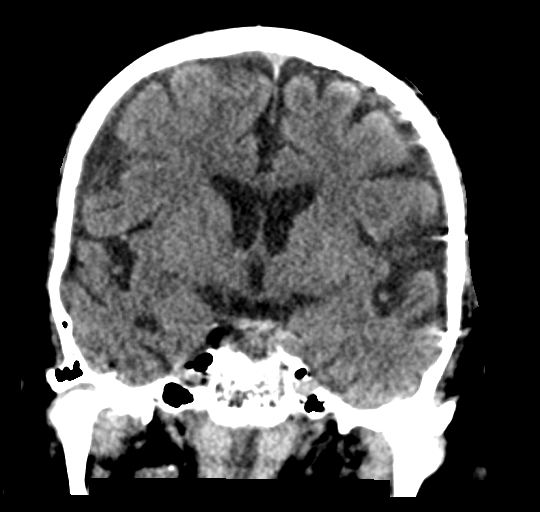

[Series 6: head without sag · sagittal · non-contrast · 0.32mm/px · 3 of 59 slices shown]
[im 20/59  brain]
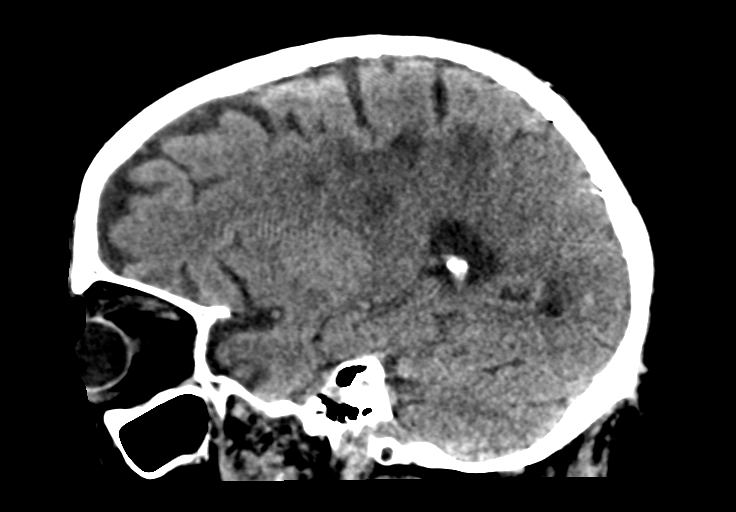
[im 30/59  brain]
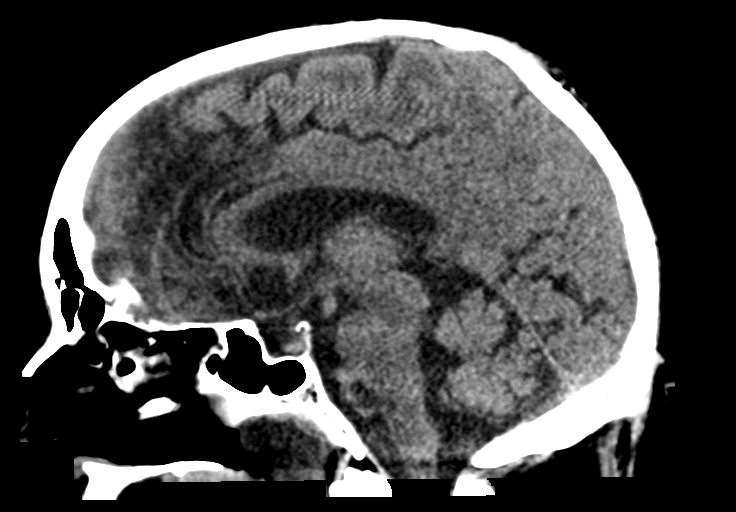
[im 39/59  brain]
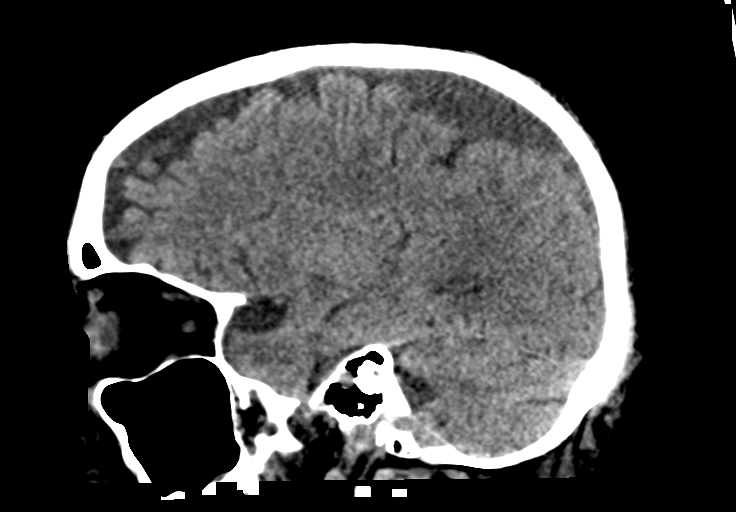

[15 of 47 positions shown; findings below may reference images not displayed]

FINDINGS: Brain: A right extra-axial intermediate density collection is
increasing in size, now measuring 14.5 mm on coronal images compared
with 11 mm previously. There is mass effect with effacement of the
adjacent sulci. No significant midline shift is present. There is
partial effacement the right lateral ventricle.

No acute infarct or parenchymal hemorrhage is present. Basal ganglia
are intact. Remote lacunar infarct of the left pons is noted. The
brainstem and cerebellum are otherwise unremarkable.

Vascular: Atherosclerotic calcifications are present within the
cavernous internal carotid arteries bilaterally and at the dural
margin of the vertebral arteries bilaterally. There is no hyperdense
vessel.

Skull: Teeth calvarium is intact. No focal lytic or blastic lesions
are present.

Sinuses/Orbits: The paranasal sinuses and mastoid air cells are
clear. Globes and orbits are within normal limits.
IMPRESSION: 1. Increasing size of intermediate density extra-axial collection
over the right parietal lobe with local mass effect. This raises
concern for acute on chronic subdural or epidural hematoma. No
hyperdense blood products are present.
2. Otherwise stable atrophy and white matter disease. No acute
infarct.
